# Patient Record
Sex: Male | Born: 1955 | Race: White | Hispanic: No | Marital: Single | Smoking: Former smoker
Health system: Southern US, Community
[De-identification: ages and names within clinical notes are randomized; demographics above are authoritative.]

## PROBLEM LIST (undated history)

## (undated) DIAGNOSIS — G629 Polyneuropathy, unspecified: Secondary | ICD-10-CM

## (undated) DIAGNOSIS — G4730 Sleep apnea, unspecified: Secondary | ICD-10-CM

## (undated) DIAGNOSIS — F319 Bipolar disorder, unspecified: Secondary | ICD-10-CM

## (undated) DIAGNOSIS — F32A Depression, unspecified: Secondary | ICD-10-CM

## (undated) DIAGNOSIS — F209 Schizophrenia, unspecified: Secondary | ICD-10-CM

## (undated) DIAGNOSIS — K5900 Constipation, unspecified: Secondary | ICD-10-CM

## (undated) DIAGNOSIS — E785 Hyperlipidemia, unspecified: Secondary | ICD-10-CM

## (undated) DIAGNOSIS — E291 Testicular hypofunction: Secondary | ICD-10-CM

## (undated) DIAGNOSIS — Z87438 Personal history of other diseases of male genital organs: Secondary | ICD-10-CM

## (undated) DIAGNOSIS — E039 Hypothyroidism, unspecified: Secondary | ICD-10-CM

## (undated) DIAGNOSIS — R7303 Prediabetes: Secondary | ICD-10-CM

## (undated) DIAGNOSIS — L409 Psoriasis, unspecified: Secondary | ICD-10-CM

## (undated) DIAGNOSIS — R609 Edema, unspecified: Secondary | ICD-10-CM

## (undated) DIAGNOSIS — K439 Ventral hernia without obstruction or gangrene: Secondary | ICD-10-CM

## (undated) DIAGNOSIS — E079 Disorder of thyroid, unspecified: Secondary | ICD-10-CM

## (undated) DIAGNOSIS — F419 Anxiety disorder, unspecified: Secondary | ICD-10-CM

## (undated) DIAGNOSIS — F329 Major depressive disorder, single episode, unspecified: Secondary | ICD-10-CM

## (undated) DIAGNOSIS — I739 Peripheral vascular disease, unspecified: Secondary | ICD-10-CM

## (undated) HISTORY — PX: KNEE ARTHROSCOPY: SUR90

## (undated) HISTORY — PX: POSTERIOR FUSION LUMBAR SPINE: SUR632

## (undated) HISTORY — DX: Polyneuropathy, unspecified: G62.9

## (undated) HISTORY — PX: COLONOSCOPY: SHX174

## (undated) HISTORY — DX: Depression, unspecified: F32.A

## (undated) HISTORY — PX: WISDOM TOOTH EXTRACTION: SHX21

## (undated) HISTORY — DX: Edema, unspecified: R60.9

## (undated) HISTORY — DX: Major depressive disorder, single episode, unspecified: F32.9

## (undated) HISTORY — DX: Constipation, unspecified: K59.00

## (undated) HISTORY — PX: KIDNEY SURGERY: SHX687

## (undated) HISTORY — DX: Disorder of thyroid, unspecified: E07.9

## (undated) HISTORY — DX: Anxiety disorder, unspecified: F41.9

---

## 2009-01-09 ENCOUNTER — Ambulatory Visit: Payer: Self-pay | Admitting: Orthopedic Surgery

## 2009-02-12 ENCOUNTER — Ambulatory Visit: Payer: Self-pay | Admitting: Orthopedic Surgery

## 2009-02-18 ENCOUNTER — Ambulatory Visit: Payer: Self-pay | Admitting: Orthopedic Surgery

## 2009-08-22 ENCOUNTER — Ambulatory Visit: Payer: Self-pay | Admitting: Internal Medicine

## 2009-09-03 ENCOUNTER — Ambulatory Visit: Payer: Self-pay | Admitting: Internal Medicine

## 2010-01-09 ENCOUNTER — Ambulatory Visit: Payer: Self-pay | Admitting: Orthopedic Surgery

## 2010-02-03 ENCOUNTER — Ambulatory Visit: Payer: Self-pay | Admitting: Pain Medicine

## 2010-02-19 ENCOUNTER — Ambulatory Visit: Payer: Self-pay | Admitting: Pain Medicine

## 2010-04-09 ENCOUNTER — Ambulatory Visit: Payer: Self-pay | Admitting: Pain Medicine

## 2010-04-23 ENCOUNTER — Ambulatory Visit: Payer: Self-pay | Admitting: Pain Medicine

## 2010-11-02 ENCOUNTER — Ambulatory Visit: Payer: Self-pay | Admitting: Unknown Physician Specialty

## 2010-12-06 HISTORY — PX: BACK SURGERY: SHX140

## 2011-03-03 ENCOUNTER — Ambulatory Visit: Payer: Self-pay | Admitting: Pain Medicine

## 2011-03-11 ENCOUNTER — Ambulatory Visit: Payer: Self-pay | Admitting: Pain Medicine

## 2011-03-24 ENCOUNTER — Ambulatory Visit: Payer: Self-pay | Admitting: Pain Medicine

## 2011-03-25 ENCOUNTER — Other Ambulatory Visit: Payer: Self-pay | Admitting: Pain Medicine

## 2011-04-01 ENCOUNTER — Ambulatory Visit: Payer: Self-pay | Admitting: Pain Medicine

## 2011-06-18 ENCOUNTER — Ambulatory Visit: Payer: Self-pay | Admitting: Unknown Physician Specialty

## 2011-08-05 ENCOUNTER — Ambulatory Visit: Payer: Self-pay | Admitting: Unknown Physician Specialty

## 2011-08-10 ENCOUNTER — Inpatient Hospital Stay: Payer: Self-pay | Admitting: Unknown Physician Specialty

## 2013-10-03 ENCOUNTER — Ambulatory Visit (INDEPENDENT_AMBULATORY_CARE_PROVIDER_SITE_OTHER): Payer: PRIVATE HEALTH INSURANCE | Admitting: Podiatry

## 2013-10-03 ENCOUNTER — Encounter: Payer: Self-pay | Admitting: Podiatry

## 2013-10-03 VITALS — BP 123/84 | HR 75 | Resp 16 | Ht 72.0 in | Wt 280.0 lb

## 2013-10-03 DIAGNOSIS — B351 Tinea unguium: Secondary | ICD-10-CM

## 2013-10-03 DIAGNOSIS — M79609 Pain in unspecified limb: Secondary | ICD-10-CM

## 2013-10-03 NOTE — Progress Notes (Signed)
Geoffrey Cruz presents today as a diabetic individual with a chief complaint of painful toenails bilateral.  Objective: Vital signs are stable he is alert and oriented x3. Nails are thick yellow dystrophic clinically mycotic and painful on palpation and debridement.  Assessment: Pain in limb secondary to onychomycosis.  Plan: The right nails 1 through 5 bilateral covered service secondary to pain. Followup with him in 3 months.

## 2013-11-05 ENCOUNTER — Ambulatory Visit: Payer: Self-pay | Admitting: Unknown Physician Specialty

## 2013-12-10 ENCOUNTER — Encounter: Payer: Self-pay | Admitting: Podiatry

## 2013-12-10 ENCOUNTER — Ambulatory Visit (INDEPENDENT_AMBULATORY_CARE_PROVIDER_SITE_OTHER): Payer: PRIVATE HEALTH INSURANCE | Admitting: Podiatry

## 2013-12-10 VITALS — BP 126/77 | HR 71 | Resp 16

## 2013-12-10 DIAGNOSIS — M79609 Pain in unspecified limb: Secondary | ICD-10-CM

## 2013-12-10 DIAGNOSIS — B353 Tinea pedis: Secondary | ICD-10-CM

## 2013-12-10 DIAGNOSIS — B351 Tinea unguium: Secondary | ICD-10-CM

## 2013-12-10 NOTE — Progress Notes (Signed)
   Subjective:    Patient ID: Geoffrey Cruz, male    DOB: September 28, 1956, 58 y.o.   MRN: [id number]  HPI Comments: " trim my toenails and check the 3rd toenail left foot , its dead or something"     Review of Systems     Objective:   Physical Exam: Pulses are palpable bilateral. Nails are thick yellow dystrophic clinically mycotic and painful on palpation.        Assessment & Plan:  Assessment: Pain in limb secondary to onychomycosis.  Plan: Debridement of nails 1 through 5 bilateral is cover service secondary to pain. Followup with him in 3 months.

## 2013-12-18 ENCOUNTER — Encounter: Payer: Self-pay | Admitting: Urology

## 2013-12-18 LAB — HEMOGLOBIN: HGB: 17.5 g/dL (ref 13.0–18.0)

## 2013-12-18 LAB — HEMATOCRIT: HCT: 51.2 % (ref 40.0–52.0)

## 2013-12-26 LAB — CBC WITH DIFFERENTIAL/PLATELET
Basophil #: 0.1 10*3/uL (ref 0.0–0.1)
Basophil %: 1 %
Eosinophil #: 0.2 10*3/uL (ref 0.0–0.7)
Eosinophil %: 2 %
HCT: 48.5 % (ref 40.0–52.0)
HGB: 16.3 g/dL (ref 13.0–18.0)
Lymphocyte #: 2.2 10*3/uL (ref 1.0–3.6)
Lymphocyte %: 20.8 %
MCH: 30.6 pg (ref 26.0–34.0)
MCHC: 33.5 g/dL (ref 32.0–36.0)
MCV: 91 fL (ref 80–100)
Monocyte #: 1 x10 3/mm (ref 0.2–1.0)
Monocyte %: 9.5 %
NEUTROS ABS: 7.2 10*3/uL — AB (ref 1.4–6.5)
Neutrophil %: 66.7 %
PLATELETS: 196 10*3/uL (ref 150–440)
RBC: 5.31 10*6/uL (ref 4.40–5.90)
RDW: 13.9 % (ref 11.5–14.5)
WBC: 10.7 10*3/uL — ABNORMAL HIGH (ref 3.8–10.6)

## 2014-01-02 ENCOUNTER — Ambulatory Visit: Payer: PRIVATE HEALTH INSURANCE | Admitting: Podiatry

## 2014-01-06 ENCOUNTER — Encounter: Payer: Self-pay | Admitting: Urology

## 2014-03-11 ENCOUNTER — Ambulatory Visit: Payer: PRIVATE HEALTH INSURANCE | Admitting: Podiatry

## 2014-03-18 ENCOUNTER — Ambulatory Visit (INDEPENDENT_AMBULATORY_CARE_PROVIDER_SITE_OTHER): Payer: PRIVATE HEALTH INSURANCE | Admitting: Podiatry

## 2014-03-18 VITALS — Resp 16 | Ht 72.0 in | Wt 275.0 lb

## 2014-03-18 DIAGNOSIS — B353 Tinea pedis: Secondary | ICD-10-CM

## 2014-03-18 DIAGNOSIS — M79609 Pain in unspecified limb: Secondary | ICD-10-CM

## 2014-03-18 NOTE — Progress Notes (Signed)
He presents today chief complaint of painful toenails one through 5 bilateral.  Objective: Pulses are palpable bilateral. Nails are thick yellow dystrophic with mycotic painful palpation as well as debridement with subungual debris.  Assessment: Pain in limb secondary to onychomycosis 1-5 bilateral.  Plan: Debridement of nails 1 through 5 bilateral covered service secondary to pain.

## 2014-06-17 ENCOUNTER — Encounter: Payer: Self-pay | Admitting: Podiatry

## 2014-06-17 ENCOUNTER — Ambulatory Visit (INDEPENDENT_AMBULATORY_CARE_PROVIDER_SITE_OTHER): Payer: PRIVATE HEALTH INSURANCE | Admitting: Podiatry

## 2014-06-17 DIAGNOSIS — M79676 Pain in unspecified toe(s): Secondary | ICD-10-CM

## 2014-06-17 DIAGNOSIS — M79609 Pain in unspecified limb: Secondary | ICD-10-CM

## 2014-06-17 DIAGNOSIS — B351 Tinea unguium: Secondary | ICD-10-CM

## 2014-06-17 NOTE — Progress Notes (Signed)
He presents today complaining of painful elongated toenails  Objective: Vital signs are stable he is alert and oriented x3. Nails are thick yellow dystrophic with mycotic and painful palpation.  Assessment: Pain in limb secondary to onychomycosis 1 through 5 bilateral.  Plan: Debridement nails 1 through 5 bilateral covered service secondary to pain.

## 2014-09-23 ENCOUNTER — Ambulatory Visit (INDEPENDENT_AMBULATORY_CARE_PROVIDER_SITE_OTHER): Payer: PRIVATE HEALTH INSURANCE | Admitting: Podiatry

## 2014-09-23 DIAGNOSIS — M79676 Pain in unspecified toe(s): Secondary | ICD-10-CM

## 2014-09-23 DIAGNOSIS — B351 Tinea unguium: Secondary | ICD-10-CM

## 2014-09-23 NOTE — Progress Notes (Signed)
Presents today chief complaint of painful elongated toenails.  Objective: Pulses are palpable bilateral nails are thick, yellow dystrophic onychomycosis and painful palpation.   Assessment: Onychomycosis with pain in limb.  Plan: Treatment of nails in thickness and length as covered service secondary to pain.  

## 2014-12-04 ENCOUNTER — Ambulatory Visit (INDEPENDENT_AMBULATORY_CARE_PROVIDER_SITE_OTHER): Payer: PRIVATE HEALTH INSURANCE | Admitting: Podiatry

## 2014-12-04 VITALS — BP 137/81 | HR 71 | Resp 16

## 2014-12-04 DIAGNOSIS — B351 Tinea unguium: Secondary | ICD-10-CM

## 2014-12-04 DIAGNOSIS — M79676 Pain in unspecified toe(s): Secondary | ICD-10-CM

## 2014-12-04 NOTE — Progress Notes (Signed)
He presents today to complaint of painful elongated toenails and calluses bilateral.  Objective: Vital signs are stable he is alert and oriented 3. There is no erythema the edema cellulitis drainage or odor. Nails are thick yellow dystrophic clinically mycotic and painful on palpation.  Assessment: Pain and limb secondary to onychomycosis 1 through 5 bilateral.  Plan: Debridement of nails 1 through 5 bilateral cover service secondary to pain.

## 2014-12-23 ENCOUNTER — Ambulatory Visit: Payer: PRIVATE HEALTH INSURANCE

## 2014-12-23 ENCOUNTER — Ambulatory Visit: Payer: PRIVATE HEALTH INSURANCE | Admitting: Podiatry

## 2014-12-30 DIAGNOSIS — R739 Hyperglycemia, unspecified: Secondary | ICD-10-CM | POA: Insufficient documentation

## 2015-01-08 ENCOUNTER — Ambulatory Visit (INDEPENDENT_AMBULATORY_CARE_PROVIDER_SITE_OTHER): Payer: Commercial Managed Care - PPO | Admitting: Podiatry

## 2015-01-08 DIAGNOSIS — M722 Plantar fascial fibromatosis: Secondary | ICD-10-CM

## 2015-01-08 NOTE — Progress Notes (Signed)
He presents today for follow-up of his plantar fasciitis would like to have his nails cut if possible. He did also like to purchase appeared orthotics. He states that the ones he has now seemed to be wearing and he would like to have another pair made.  Objective: Vital signs are stable he is alert and oriented 3. Pulses are strongly palpable bilateral. His nails are thick yellow dystrophic onychomycotic a painful palpation 1 through 5 bilateral. He has pain on palpation medial calcaneal tubercles bilateral.  Assessment: Plantar fasciitis bilateral. Pain in limb second onychomycosis bilateral.  Plan: He was scanned for set of orthotics today we also debrided his nails. Follow up with him in 3 weeks if necessary. He will keep his regular nail debridement schedule.

## 2015-02-03 ENCOUNTER — Ambulatory Visit (INDEPENDENT_AMBULATORY_CARE_PROVIDER_SITE_OTHER): Payer: Commercial Managed Care - PPO | Admitting: *Deleted

## 2015-02-03 DIAGNOSIS — M722 Plantar fascial fibromatosis: Secondary | ICD-10-CM

## 2015-02-03 NOTE — Patient Instructions (Signed)

## 2015-02-03 NOTE — Progress Notes (Signed)
Orthotics dispensed. Instructions given . Will see for normal nail care appointment in 2-3 mo. Evaluate orthotics at that time also.

## 2015-03-03 ENCOUNTER — Ambulatory Visit: Payer: PRIVATE HEALTH INSURANCE

## 2015-04-07 ENCOUNTER — Ambulatory Visit (INDEPENDENT_AMBULATORY_CARE_PROVIDER_SITE_OTHER): Payer: Medicaid Other | Admitting: Podiatry

## 2015-04-07 DIAGNOSIS — B351 Tinea unguium: Secondary | ICD-10-CM | POA: Diagnosis not present

## 2015-04-07 DIAGNOSIS — M79676 Pain in unspecified toe(s): Secondary | ICD-10-CM

## 2015-04-07 NOTE — Progress Notes (Signed)
He presents today to complaint of painful elongated toenails and calluses bilateral.  Objective: Vital signs are stable he is alert and oriented 3. There is no erythema the edema cellulitis drainage or odor. Nails are thick yellow dystrophic clinically mycotic and painful on palpation.  Assessment: Pain and limb secondary to onychomycosis 1 through 5 bilateral.  Plan: Debridement of nails 1 through 5 bilateral cover service secondary to pain. 

## 2015-07-08 ENCOUNTER — Ambulatory Visit: Payer: Commercial Managed Care - PPO | Admitting: Podiatry

## 2015-07-21 ENCOUNTER — Ambulatory Visit (INDEPENDENT_AMBULATORY_CARE_PROVIDER_SITE_OTHER): Payer: Medicare Other | Admitting: Podiatry

## 2015-07-21 DIAGNOSIS — M79676 Pain in unspecified toe(s): Secondary | ICD-10-CM

## 2015-07-21 DIAGNOSIS — B351 Tinea unguium: Secondary | ICD-10-CM

## 2015-07-21 NOTE — Progress Notes (Signed)
He presents today to complaint of painful elongated toenails and calluses bilateral. States that his thyroid disease and stable adhesive has some numbness and tingling to the bilateral foot.  Objective: Vital signs are stable he is alert and oriented 3. There is no erythema the edema cellulitis drainage or odor. Nails are thick yellow dystrophic clinically mycotic and painful on palpation. Pulses are strongly palpable bilateral.  Assessment: Pain and limb secondary to onychomycosis 1 through 5 bilateral. Neuropathy associated with hypothyroidism  Plan: Debridement of nails 1 through 5 bilateral cover service secondary to pain. Continue application of topical analgesias.

## 2015-10-20 ENCOUNTER — Ambulatory Visit (INDEPENDENT_AMBULATORY_CARE_PROVIDER_SITE_OTHER): Payer: Medicare Other | Admitting: Podiatry

## 2015-10-20 DIAGNOSIS — M79676 Pain in unspecified toe(s): Secondary | ICD-10-CM | POA: Diagnosis not present

## 2015-10-20 DIAGNOSIS — B351 Tinea unguium: Secondary | ICD-10-CM

## 2015-10-20 NOTE — Progress Notes (Signed)
He presents today to complaint of painful elongated toenails and calluses bilateral.  Objective: Vital signs are stable he is alert and oriented 3. There is no erythema the edema cellulitis drainage or odor. Nails are thick yellow dystrophic clinically mycotic and painful on palpation.  Assessment: Pain and limb secondary to onychomycosis 1 through 5 bilateral.  Plan: Debridement of nails 1 through 5 bilateral cover service secondary to pain.3 mo. Skin softener to distal hallux callus/dryness.  Arbutus Pedodd Hyatt DPM

## 2016-01-20 ENCOUNTER — Encounter: Payer: Self-pay | Admitting: Sports Medicine

## 2016-01-20 ENCOUNTER — Ambulatory Visit (INDEPENDENT_AMBULATORY_CARE_PROVIDER_SITE_OTHER): Payer: Medicare Other | Admitting: Sports Medicine

## 2016-01-20 DIAGNOSIS — M79676 Pain in unspecified toe(s): Secondary | ICD-10-CM | POA: Diagnosis not present

## 2016-01-20 DIAGNOSIS — B351 Tinea unguium: Secondary | ICD-10-CM | POA: Diagnosis not present

## 2016-01-20 DIAGNOSIS — G629 Polyneuropathy, unspecified: Secondary | ICD-10-CM | POA: Diagnosis not present

## 2016-01-20 NOTE — Progress Notes (Signed)
Patient ID: MATH BRAZIE, male   DOB: 25-Jul-1956, 60 y.o.   MRN: [id number] Subjective: Geoffrey Cruz is a 60 y.o. male patient seen today in office with complaint of painful thickened and elongated toenails; unable to trim. Patient denies history of Diabetes or Vascular disease. 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 Patient has no other pedal complaints at this time.   There are no active problems to display for this patient.  Current Outpatient Prescriptions on File Prior to Visit  Medication Sig Dispense Refill  . ARIPiprazole (ABILIFY) 20 MG tablet Take 20 mg by mouth daily.    Marland Kitchen aspirin 81 MG tablet Take 81 mg by mouth daily.    Marland Kitchen buPROPion (WELLBUTRIN SR) 200 MG 12 hr tablet Take 200 mg by mouth 2 (two) times daily.    . clonazePAM (KLONOPIN) 1 MG tablet Take 1 mg by mouth 2 (two) times daily as needed for anxiety.    . fluvoxaMINE (LUVOX) 100 MG tablet Take 100 mg by mouth 2 (two) times daily.    . furosemide (LASIX) 40 MG tablet Take 40 mg by mouth daily.    Marland Kitchen levothyroxine (SYNTHROID, LEVOTHROID) 88 MCG tablet Take 88 mcg by mouth daily before breakfast.     No current facility-administered medications on file prior to visit.   Allergies  Allergen Reactions  . Hydrocodone-Acetaminophen Swelling    Swelling in ankles  . Propoxyphene Nausea Only  . Codeine Other (See Comments) and Anxiety    NERVOUS FEELING   Objective: Physical Exam  General: Well developed, nourished, no acute distress, awake, alert and oriented x 3  Vascular: Dorsalis pedis artery 2/4 bilateral, Posterior tibial artery 1/4 bilateral, skin temperature warm to warm proximal to distal bilateral lower extremities, no varicosities, scant pedal hair present bilateral.  Neurological: Gross sensation present via light touch bilateral. Vibratory diminished bilateral using tuning fork. Protective absent to toes bilateral using SWMF.   Dermatological: Skin is warm, dry, and supple bilateral,  Nails 1-10 are tender, long, thick, and discolored with mild subungal debris, no webspace macerations present bilateral, no open lesions present bilateral, no callus/corns/hyperkeratotic tissue present bilateral. No signs of infection bilateral.  Musculoskeletal: No boney deformities noted bilateral. Muscular strength within normal limits without pain or limitation on range of motion. No pain with calf compression bilateral.  Assessment and Plan:  Problem List Items Addressed This Visit    None    Visit Diagnoses    Dermatophytosis of nail    -  Primary    Pain of toe, unspecified laterality        Peripheral polyneuropathy (HCC)        Relevant Medications    clonazePAM (KLONOPIN) 0.5 MG tablet       -Examined patient.  -Discussed treatment options for painful mycotic nails. -Mechanically debrided and reduced mycotic nails with sterile nail nipper and dremel nail file without incident. -Recommend good supportive shoes daily and daily foot inspection -Recommend daily skin emollients  -Patient to return in 3 months for follow up evaluation or sooner if symptoms worsen.  Asencion Islam, DPM

## 2016-04-20 ENCOUNTER — Ambulatory Visit (INDEPENDENT_AMBULATORY_CARE_PROVIDER_SITE_OTHER): Payer: Medicare Other | Admitting: Sports Medicine

## 2016-04-20 ENCOUNTER — Encounter: Payer: Self-pay | Admitting: Sports Medicine

## 2016-04-20 DIAGNOSIS — B351 Tinea unguium: Secondary | ICD-10-CM

## 2016-04-20 DIAGNOSIS — F209 Schizophrenia, unspecified: Secondary | ICD-10-CM | POA: Insufficient documentation

## 2016-04-20 DIAGNOSIS — F329 Major depressive disorder, single episode, unspecified: Secondary | ICD-10-CM | POA: Insufficient documentation

## 2016-04-20 DIAGNOSIS — M79676 Pain in unspecified toe(s): Secondary | ICD-10-CM | POA: Diagnosis not present

## 2016-04-20 DIAGNOSIS — F32A Depression, unspecified: Secondary | ICD-10-CM | POA: Insufficient documentation

## 2016-04-20 DIAGNOSIS — G629 Polyneuropathy, unspecified: Secondary | ICD-10-CM

## 2016-04-20 DIAGNOSIS — Z87438 Personal history of other diseases of male genital organs: Secondary | ICD-10-CM | POA: Insufficient documentation

## 2016-04-20 DIAGNOSIS — E039 Hypothyroidism, unspecified: Secondary | ICD-10-CM | POA: Insufficient documentation

## 2016-04-20 DIAGNOSIS — G4730 Sleep apnea, unspecified: Secondary | ICD-10-CM | POA: Insufficient documentation

## 2016-04-20 NOTE — Progress Notes (Signed)
Patient ID: Magnus SinningDonald T Heeren, male   DOB: [birth year], 60 y.o.   MRN: [id number]   Subjective: Magnus SinningDonald T Schlatter is a 60 y.o. male patient seen today in office with complaint of painful thickened and elongated toenails; unable to trim. Patient denies history of Diabetes or Vascular disease. 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 Patient has no other pedal complaints at this time.   Patient Active Problem List   Diagnosis Date Noted  . Clinical depression 04/20/2016  . Personal history of other diseases of male genital organs 04/20/2016  . Adult hypothyroidism 04/20/2016  . Schizophrenia (HCC) 04/20/2016  . Apnea, sleep 04/20/2016  . Chronic hyperglycemia 12/30/2014   Current Outpatient Prescriptions on File Prior to Visit  Medication Sig Dispense Refill  . Acetaminophen 500 MG coapsule Take by mouth.    . ARIPiprazole (ABILIFY) 20 MG tablet Take 20 mg by mouth daily.    Marland Kitchen. aspirin 81 MG tablet Take 81 mg by mouth daily.    Marland Kitchen. buPROPion (WELLBUTRIN SR) 200 MG 12 hr tablet Take 200 mg by mouth 2 (two) times daily.    . clonazePAM (KLONOPIN) 0.5 MG tablet     . clonazePAM (KLONOPIN) 1 MG tablet Take 1 mg by mouth 2 (two) times daily as needed for anxiety.    . fluticasone (FLONASE) 50 MCG/ACT nasal spray Place into the nose.    . fluvoxaMINE (LUVOX) 100 MG tablet Take 100 mg by mouth 2 (two) times daily.    . furosemide (LASIX) 40 MG tablet Take 40 mg by mouth daily.    Marland Kitchen. levothyroxine (SYNTHROID, LEVOTHROID) 88 MCG tablet Take 88 mcg by mouth daily before breakfast.    . tamsulosin (FLOMAX) 0.4 MG CAPS capsule      No current facility-administered medications on file prior to visit.   Allergies  Allergen Reactions  . Hydrocodone-Acetaminophen Swelling    Swelling in ankles  . Propoxyphene Nausea Only  . Codeine Other (See Comments) and Anxiety    NERVOUS FEELING   Objective: Physical Exam  General: Well developed, nourished, no acute distress, awake, alert and  oriented x 3  Vascular: Dorsalis pedis artery 2/4 bilateral, Posterior tibial artery 1/4 bilateral, skin temperature warm to warm proximal to distal bilateral lower extremities, no varicosities, scant pedal hair present bilateral.  Neurological: Gross sensation present via light touch bilateral. Vibratory diminished bilateral using tuning fork. Protective absent to toes bilateral using SWMF.   Dermatological: Skin is warm, dry, and supple bilateral, Nails 1-10 are tender, long, thick, and discolored with mild subungal debris, no webspace macerations present bilateral, no open lesions present bilateral, no callus/corns/hyperkeratotic tissue present bilateral. No signs of infection bilateral.  Musculoskeletal: No boney deformities noted bilateral. Muscular strength within normal limits without pain or limitation on range of motion. No pain with calf compression bilateral.  Assessment and Plan:  Problem List Items Addressed This Visit    None    Visit Diagnoses    Dermatophytosis of nail    -  Primary    Pain of toe, unspecified laterality        Peripheral polyneuropathy (HCC)           -Examined patient.  -Discussed treatment options for painful mycotic nails. -Mechanically debrided and reduced mycotic nails with sterile nail nipper and dremel nail file without incident. -Recommend good supportive shoes daily and daily foot inspection -Recommend daily skin emollients  -Patient to return in 3 months for follow up evaluation or sooner if symptoms  worsen.  Landis Martins, DPM

## 2016-07-27 ENCOUNTER — Encounter: Payer: Self-pay | Admitting: Sports Medicine

## 2016-07-27 ENCOUNTER — Ambulatory Visit (INDEPENDENT_AMBULATORY_CARE_PROVIDER_SITE_OTHER): Payer: Medicare Other | Admitting: Sports Medicine

## 2016-07-27 DIAGNOSIS — M79676 Pain in unspecified toe(s): Secondary | ICD-10-CM | POA: Diagnosis not present

## 2016-07-27 DIAGNOSIS — B351 Tinea unguium: Secondary | ICD-10-CM | POA: Diagnosis not present

## 2016-07-27 DIAGNOSIS — G629 Polyneuropathy, unspecified: Secondary | ICD-10-CM

## 2016-07-27 NOTE — Progress Notes (Signed)
Patient ID: Geoffrey SinningDonald T Cruz, male   DOB: 09/18/1956, 60 y.o.   MRN: [id number]   Subjective: Geoffrey Cruz is a 60 y.o. male patient seen today in office with complaint of painful thickened and elongated toenails; unable to trim. Patient denies changes with medical history. 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 Patient has no other pedal complaints at this time.   Patient Active Problem List   Diagnosis Date Noted  . Clinical depression 04/20/2016  . Personal history of other diseases of male genital organs 04/20/2016  . Adult hypothyroidism 04/20/2016  . Schizophrenia (HCC) 04/20/2016  . Apnea, sleep 04/20/2016  . Chronic hyperglycemia 12/30/2014   Current Outpatient Prescriptions on File Prior to Visit  Medication Sig Dispense Refill  . Acetaminophen 500 MG coapsule Take by mouth.    . ARIPiprazole (ABILIFY) 20 MG tablet Take 20 mg by mouth daily.    Marland Kitchen. aspirin 81 MG tablet Take 81 mg by mouth daily.    Marland Kitchen. buPROPion (WELLBUTRIN SR) 200 MG 12 hr tablet Take 200 mg by mouth 2 (two) times daily.    . clonazePAM (KLONOPIN) 0.5 MG tablet     . clonazePAM (KLONOPIN) 1 MG tablet Take 1 mg by mouth 2 (two) times daily as needed for anxiety.    . fluticasone (FLONASE) 50 MCG/ACT nasal spray Place into the nose.    . fluvoxaMINE (LUVOX) 100 MG tablet Take 100 mg by mouth 2 (two) times daily.    . furosemide (LASIX) 40 MG tablet Take 40 mg by mouth daily.    Marland Kitchen. levothyroxine (SYNTHROID, LEVOTHROID) 88 MCG tablet Take 88 mcg by mouth daily before breakfast.    . tamsulosin (FLOMAX) 0.4 MG CAPS capsule      No current facility-administered medications on file prior to visit.    Allergies  Allergen Reactions  . Hydrocodone-Acetaminophen Swelling    Swelling in ankles  . Propoxyphene Nausea Only  . Codeine Other (See Comments) and Anxiety    NERVOUS FEELING   Objective: Physical Exam  General: Well developed, nourished, no acute distress, awake, alert and oriented x  3  Vascular: Dorsalis pedis artery 2/4 bilateral, Posterior tibial artery 1/4 bilateral, skin temperature warm to warm proximal to distal bilateral lower extremities, no varicosities, scant pedal hair present bilateral.  Neurological: Gross sensation present via light touch bilateral. Vibratory diminished bilateral using tuning fork. Protective absent to toes bilateral using SWMF.   Dermatological: Skin is warm, dry, and supple bilateral, Nails 1-10 are tender, long, thick, and discolored with mild subungal debris, no webspace macerations present bilateral, no open lesions present bilateral, no callus/corns/hyperkeratotic tissue present bilateral. No signs of infection bilateral.  Musculoskeletal: Mild hallux malleous boney deformities noted bilateral R>L. Muscular strength within normal limits without pain or limitation on range of motion. No pain with calf compression bilateral.  Assessment and Plan:  Problem List Items Addressed This Visit    None    Visit Diagnoses    Dermatophytosis of nail    -  Primary   Pain of toe, unspecified laterality       Peripheral polyneuropathy (HCC)          -Examined patient.  -Discussed treatment options for painful mycotic nails. -Mechanically debrided and reduced mycotic nails with sterile nail nipper and dremel nail file without incident. -Recommend good supportive shoes daily and daily foot inspection -Recommend daily skin emollients  -Toe silicone pad right hallux to use as instructed  -Patient to return in 3  months for follow up evaluation or sooner if symptoms worsen.  Landis Martins, DPM

## 2016-11-02 ENCOUNTER — Ambulatory Visit (INDEPENDENT_AMBULATORY_CARE_PROVIDER_SITE_OTHER): Payer: Medicare Other | Admitting: Podiatry

## 2016-11-02 ENCOUNTER — Encounter: Payer: Self-pay | Admitting: Podiatry

## 2016-11-02 DIAGNOSIS — L608 Other nail disorders: Secondary | ICD-10-CM

## 2016-11-02 DIAGNOSIS — M79609 Pain in unspecified limb: Secondary | ICD-10-CM

## 2016-11-02 DIAGNOSIS — B351 Tinea unguium: Secondary | ICD-10-CM

## 2016-11-02 DIAGNOSIS — L603 Nail dystrophy: Secondary | ICD-10-CM

## 2016-11-03 NOTE — Progress Notes (Signed)

## 2017-01-31 ENCOUNTER — Encounter: Payer: Self-pay | Admitting: Podiatry

## 2017-01-31 ENCOUNTER — Ambulatory Visit (INDEPENDENT_AMBULATORY_CARE_PROVIDER_SITE_OTHER): Payer: Medicare Other | Admitting: Podiatry

## 2017-01-31 DIAGNOSIS — M79609 Pain in unspecified limb: Secondary | ICD-10-CM | POA: Diagnosis not present

## 2017-01-31 DIAGNOSIS — G629 Polyneuropathy, unspecified: Secondary | ICD-10-CM

## 2017-01-31 DIAGNOSIS — B351 Tinea unguium: Secondary | ICD-10-CM | POA: Diagnosis not present

## 2017-01-31 NOTE — Progress Notes (Signed)
Complaint:  Visit Type: Patient returns to my office for continued preventative foot care services. 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 The patient presents for preventative foot care services. No changes to ROS  Podiatric Exam: Vascular: dorsalis pedis and posterior tibial pulses are palpable bilateral. Capillary return is immediate. Temperature gradient is WNL. Skin turgor WNL  Sensorium: Diminished Semmes Weinstein monofilament test. Normal tactile sensation bilaterally. Nail Exam: Pt has thick disfigured discolored nails with subungual debris noted bilateral entire nail hallux through fifth toenails Ulcer Exam: There is no evidence of ulcer or pre-ulcerative changes or infection. Orthopedic Exam: Muscle tone and strength are WNL. No limitations in general ROM. No crepitus or effusions noted. Foot type and digits show no abnormalities. Bony prominences are unremarkable. Skin: No Porokeratosis. No infection or ulcers  Diagnosis:  Onychomycosis, , Pain in right toe, pain in left toes  Treatment & Plan Procedures and Treatment: Consent by patient was obtained for treatment procedures. The patient understood the discussion of treatment and procedures well. All questions were answered thoroughly reviewed. Debridement of mycotic and hypertrophic toenails, 1 through 5 bilateral and clearing of subungual debris. No ulceration, no infection noted.  Return Visit-Office Procedure: Patient instructed to return to the office for a follow up visit 3 months for continued evaluation and treatment.    Helane GuntherGregory Jeris Roser DPM

## 2017-03-23 ENCOUNTER — Encounter: Payer: Self-pay | Admitting: Podiatry

## 2017-03-23 ENCOUNTER — Ambulatory Visit (INDEPENDENT_AMBULATORY_CARE_PROVIDER_SITE_OTHER): Payer: Medicare Other | Admitting: Podiatry

## 2017-03-23 DIAGNOSIS — M722 Plantar fascial fibromatosis: Secondary | ICD-10-CM

## 2017-03-23 NOTE — Progress Notes (Signed)
Geoffrey Cruz presents today with chief complaint of bilateral heel pain times past month. He states that the orthotics may be wearing out. He states he's tried new shoes.  Objective: Vital signs are stable he is alert and oriented 3 pulses are palpable. Neurologic sensorium is intact. Deep tendon reflexes are intact. He has severe pain on palpation medially continue tubercles bilateral no pain on medial and lateral compression of the calcaneus. Radiographs were not taken today because of equipment failure. assessment: Pain in limb secondary to onychomycosis.  Plantar fasciitis bilateral.  Plan: Injection bilateral heels today he was scanned for set of orthotics.

## 2017-04-20 ENCOUNTER — Ambulatory Visit: Payer: Medicare Other | Admitting: Podiatry

## 2017-05-05 ENCOUNTER — Encounter: Payer: Self-pay | Admitting: Podiatry

## 2017-05-05 ENCOUNTER — Ambulatory Visit: Payer: Medicare Other

## 2017-05-05 ENCOUNTER — Ambulatory Visit (INDEPENDENT_AMBULATORY_CARE_PROVIDER_SITE_OTHER): Payer: Medicare Other | Admitting: Podiatry

## 2017-05-05 DIAGNOSIS — B351 Tinea unguium: Secondary | ICD-10-CM

## 2017-05-05 DIAGNOSIS — Q828 Other specified congenital malformations of skin: Secondary | ICD-10-CM | POA: Diagnosis not present

## 2017-05-05 DIAGNOSIS — M79673 Pain in unspecified foot: Secondary | ICD-10-CM | POA: Diagnosis not present

## 2017-05-05 DIAGNOSIS — G629 Polyneuropathy, unspecified: Secondary | ICD-10-CM

## 2017-05-05 NOTE — Progress Notes (Signed)
Complaint:  Visit Type: Patient returns to my office for continued preventative foot care services. 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 The patient presents for preventative foot care services. No changes to ROS.  Heels have improved from previous visit.  Painful callus left heel.  Podiatric Exam: Vascular: dorsalis pedis and posterior tibial pulses are palpable bilateral. Capillary return is immediate. Temperature gradient is WNL. Skin turgor WNL  Sensorium: Diminished Semmes Weinstein monofilament test. Normal tactile sensation bilaterally. Nail Exam: Pt has thick disfigured discolored nails with subungual debris noted bilateral entire nail hallux through fifth toenails Ulcer Exam: There is no evidence of ulcer or pre-ulcerative changes or infection. Orthopedic Exam: Muscle tone and strength are WNL. No limitations in general ROM. No crepitus or effusions noted. Foot type and digits show no abnormalities. Bony prominences are unremarkable. Skin: No Porokeratosis. No infection or ulcers  Diagnosis:  Onychomycosis, , Pain in right toe, pain in left toes  Treatment & Plan Procedures and Treatment: Consent by patient was obtained for treatment procedures. The patient understood the discussion of treatment and procedures well. All questions were answered thoroughly reviewed. Debridement of mycotic and hypertrophic toenails, 1 through 5 bilateral and clearing of subungual debris. No ulceration, no infection noted. Debride callus.  Dispense orthoses. Return Visit-Office Procedure: Patient instructed to return to the office for a follow up visit 3 months for continued evaluation and treatment.    Helane GuntherGregory Katlyne Nishida DPM

## 2017-06-14 ENCOUNTER — Ambulatory Visit (INDEPENDENT_AMBULATORY_CARE_PROVIDER_SITE_OTHER): Payer: Medicare Other | Admitting: Podiatry

## 2017-06-14 DIAGNOSIS — L03031 Cellulitis of right toe: Secondary | ICD-10-CM | POA: Diagnosis not present

## 2017-06-14 DIAGNOSIS — M79676 Pain in unspecified toe(s): Secondary | ICD-10-CM | POA: Diagnosis not present

## 2017-06-14 DIAGNOSIS — B351 Tinea unguium: Secondary | ICD-10-CM

## 2017-06-14 DIAGNOSIS — L603 Nail dystrophy: Secondary | ICD-10-CM

## 2017-06-14 MED ORDER — AMOXICILLIN-POT CLAVULANATE 875-125 MG PO TABS: 1.0000 | ORAL_TABLET | Freq: Two times a day (BID) | ORAL | 0 refills | Status: DC

## 2017-06-14 NOTE — Patient Instructions (Signed)

## 2017-06-20 ENCOUNTER — Ambulatory Visit: Payer: Medicare Other | Admitting: Podiatry

## 2017-06-22 ENCOUNTER — Ambulatory Visit: Payer: Medicare Other | Admitting: Podiatry

## 2017-06-28 ENCOUNTER — Ambulatory Visit (INDEPENDENT_AMBULATORY_CARE_PROVIDER_SITE_OTHER): Payer: Medicare Other | Admitting: Podiatry

## 2017-06-28 DIAGNOSIS — S91209D Unspecified open wound of unspecified toe(s) with damage to nail, subsequent encounter: Secondary | ICD-10-CM

## 2017-06-28 NOTE — Progress Notes (Signed)
   Subjective: Patient presents today for 2 week follow-up for total nail avulsion to the right great toe. Patient states that it's doing much better. Patient still having a little bit of redness with burning sensations however he completed his amoxicillin. He is otherwise satisfied with no new complaints.  Objective: Skin is warm, dry and supple. Open wound noted to the right great toenail avulsion site with minimal fibrotic tissue. There is no drainage. Periungual nail folds are mildly erythematous likely due to phenol reaction. There is no malodor.  Assessment: #1 postop permanent total nail avulsion right great toe #2 open wound nail bed of respective digit.   Plan of care: #1 patient was evaluated  #2 debridement of open wound was performed to the nailbed of the respective toe using a tissue nipper. Antibiotic ointment and Band-Aid was applied. #3 patient is to return to clinic on a PRN  basis.   Felecia ShellingBrent M. Evans, DPM [facility]  Dr. Felecia ShellingBrent M. Evans, DPM    [address]                                        [place], [place] [address]                Office [phone]  Fax [phone]

## 2017-07-02 NOTE — Progress Notes (Signed)
   HPI: 61 year old male presents the office today for evaluation of right great toe pain. Patient experiences redness and swelling with pain around the nailbed he states. There is a new complaint is been going on intermittently for several months however limits had a recent flareup for the past day. Patient thinks that this is due to the respective toenail. Patient denies trauma. Patient presents today for further treatment and evaluation   Physical Exam: General: The patient is alert and oriented x3 in no acute distress.  Dermatology: Thickened discolored dystrophic toenail noted to the right great toe. There does appear to be some irritation surrounding the borders of the nail plate. There is some localized cellulitis surrounding the right great toe as well. Skin is warm, dry and supple bilateral lower extremities. Negative for open lesions or macerations.  Vascular: Palpable pedal pulses bilaterally. Mild erythema likely secondary to localized cellulitis of the right great toe  Neurological: Epicritic and protective threshold grossly intact bilaterally.   Musculoskeletal Exam: Range of motion within normal limits to all pedal and ankle joints bilateral. Muscle strength 5/5 in all groups bilateral.    Assessment: 1. Painful dystrophic toenail right great toe 2. Cellulitis right great toe   Plan of Care:  1. Patient was evaluated. 2. Today I discussed the conservative versus procedural management of the toenail. The patient would like to permanently remove the toenail. All possible complications and details were explained. Prior to permanent toenail avulsion procedure 3 mL of local anesthesia infiltration was utilized and hallux block fashion. The toe was prepped in an aseptic manner and total permanent nail avulsion was performed with 330 seconds applications of phenol followed by alcohol flush. 3. Dry sterile dressings were applied. Post procedure care instructions were provided 4.  Prescription for Augmentin #14 5. Return to clinic in 2 weeks   Felecia ShellingBrent M. Evans, DPM [facility]  Dr. Felecia ShellingBrent M. Evans, DPM    [address]                                        [place], [place] [address]                Office [phone]  Fax [phone]

## 2017-07-20 DIAGNOSIS — Z1211 Encounter for screening for malignant neoplasm of colon: Secondary | ICD-10-CM | POA: Insufficient documentation

## 2017-08-01 ENCOUNTER — Ambulatory Visit: Payer: Medicare Other | Admitting: Podiatry

## 2017-08-03 ENCOUNTER — Telehealth: Payer: Self-pay | Admitting: Podiatry

## 2017-08-03 NOTE — Telephone Encounter (Signed)
This is Geoffrey Cruz. I'm a patient of Dr. Geryl Rankins (pt has been seeing Dr. Logan Bores lately though). I know this is my second message today. I have some questions. 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 Should I be wearing my compression socks and my right toe is almost purple. Please call me back at [phone].

## 2017-08-03 NOTE — Telephone Encounter (Signed)
I have a question if you could please call me back at [phone]. Thank you.

## 2017-08-03 NOTE — Telephone Encounter (Signed)
I spoke with patient, he states that his right great toe has become red, almost purple, stinging and burning in the last 2-3 days.  He denies any drainage, odor or fever.  I informed him to start soaking in Epson salt and water, use antibiotic cream and he will come in Friday to see Dr. Logan BoresEvans.  He verbally understood that if his sysmptoms get worse before Friday, that he will go to urgent care or ED.

## 2017-08-05 ENCOUNTER — Ambulatory Visit (INDEPENDENT_AMBULATORY_CARE_PROVIDER_SITE_OTHER): Payer: Medicare Other | Admitting: Podiatry

## 2017-08-05 DIAGNOSIS — L600 Ingrowing nail: Secondary | ICD-10-CM

## 2017-08-05 MED ORDER — GENTAMICIN SULFATE 0.1 % EX CREA
1.0000 | TOPICAL_CREAM | Freq: Three times a day (TID) | CUTANEOUS | 1 refills | Status: DC
Start: 2017-08-05 — End: 2017-10-26

## 2017-08-11 ENCOUNTER — Ambulatory Visit (INDEPENDENT_AMBULATORY_CARE_PROVIDER_SITE_OTHER): Payer: Medicare Other | Admitting: Podiatry

## 2017-08-11 DIAGNOSIS — M79676 Pain in unspecified toe(s): Secondary | ICD-10-CM | POA: Diagnosis not present

## 2017-08-11 DIAGNOSIS — B351 Tinea unguium: Secondary | ICD-10-CM

## 2017-08-11 NOTE — Progress Notes (Signed)
Complaint:  Visit Type: Patient returns to my office for continued preventative foot care services. 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 The patient presents for preventative foot care services. No changes to ROS.  Heels have improved from previous visit.  Painful callus left heel.  Podiatric Exam: Vascular: dorsalis pedis and posterior tibial pulses are palpable bilateral. Capillary return is immediate. Temperature gradient is WNL. Skin turgor WNL  Sensorium: Diminished Semmes Weinstein monofilament test. Normal tactile sensation bilaterally. Nail Exam: Pt has thick disfigured discolored nails with subungual debris noted bilateral entire nail hallux through fifth toenails Ulcer Exam: There is no evidence of ulcer or pre-ulcerative changes or infection. Orthopedic Exam: Muscle tone and strength are WNL. No limitations in general ROM. No crepitus or effusions noted. Foot type and digits show no abnormalities. Bony prominences are unremarkable. Skin: No Porokeratosis. No infection or ulcers  Diagnosis:  Onychomycosis, , Pain in right toe, pain in left toes  Treatment & Plan Procedures and Treatment: Consent by patient was obtained for treatment procedures. The patient understood the discussion of treatment and procedures well. All questions were answered thoroughly reviewed. Debridement of mycotic and hypertrophic toenails, 1 through 5 bilateral and clearing of subungual debris. No ulceration, no infection noted. Return Visit-Office Procedure: Patient instructed to return to the office for a follow up visit 3 months for continued evaluation and treatment.    Helane GuntherGregory Lamontae Ricardo DPM

## 2017-08-13 NOTE — Progress Notes (Signed)
   Subjective: Patient presents today for 4 week follow-up for total nail avulsion to the right great toe. Patient states that it's doing much better. Patient still having a little bit of redness with burning sensations however he states that he is much better. He is otherwise satisfied with no new complaints.  Objective: Skin is warm, dry and supple. Open wound noted to the right great toenail avulsion site with minimal fibrotic tissue. There is no drainage. Periungual nail folds are mildly erythematous likely due to phenol reaction. There is no malodor.  Assessment: #1 postop permanent total nail avulsion right great toe #2 open wound nail bed of respective digit.   Plan of care: #1 patient was evaluated  #2 debridement of open wound was performed to the nailbed of the respective toe using a tissue nipper. Antibiotic ointment and Band-Aid was applied. #3 prescription for gentamicin cream to be applied to the avulsion site daily.  #4 patient is to return to clinic on a PRN  basis.   Felecia ShellingBrent M. Kadra Kohan, DPM [facility]  Dr. Felecia ShellingBrent M. Deniro Laymon, DPM    [address]                                        [place], [place] [address]                Office [phone]  Fax [phone]

## 2017-08-30 ENCOUNTER — Ambulatory Visit
Admission: RE | Admit: 2017-08-30 | Discharge: 2017-08-30 | Disposition: A | Discharge status: Home / Self Care | Payer: Medicare Other | Source: Ambulatory Visit | Attending: Physician Assistant | Admitting: Physician Assistant

## 2017-08-30 ENCOUNTER — Other Ambulatory Visit: Payer: Self-pay | Admitting: Physician Assistant

## 2017-08-30 DIAGNOSIS — R109 Unspecified abdominal pain: Secondary | ICD-10-CM | POA: Diagnosis present

## 2017-08-30 DIAGNOSIS — K4090 Unilateral inguinal hernia, without obstruction or gangrene, not specified as recurrent: Secondary | ICD-10-CM | POA: Insufficient documentation

## 2017-08-30 DIAGNOSIS — I700 Atherosclerosis of aorta: Secondary | ICD-10-CM | POA: Insufficient documentation

## 2017-08-30 DIAGNOSIS — K5730 Diverticulosis of large intestine without perforation or abscess without bleeding: Secondary | ICD-10-CM | POA: Insufficient documentation

## 2017-08-30 MED ORDER — IOPAMIDOL (ISOVUE-370) INJECTION 76%
100.0000 mL | Freq: Once | INTRAVENOUS | Status: AC | PRN
Start: 2017-08-30 — End: 2017-08-30
  Administered 2017-08-30: 100 mL via INTRAVENOUS

## 2017-09-05 DIAGNOSIS — K439 Ventral hernia without obstruction or gangrene: Secondary | ICD-10-CM | POA: Insufficient documentation

## 2017-09-05 DIAGNOSIS — R1031 Right lower quadrant pain: Secondary | ICD-10-CM | POA: Insufficient documentation

## 2017-10-26 ENCOUNTER — Encounter: Payer: Medicare Other | Attending: Infectious Diseases | Admitting: Dietician

## 2017-10-26 ENCOUNTER — Encounter: Payer: Self-pay | Admitting: Dietician

## 2017-10-26 VITALS — Ht 72.0 in | Wt 294.9 lb

## 2017-10-26 DIAGNOSIS — Z6839 Body mass index (BMI) 39.0-39.9, adult: Secondary | ICD-10-CM | POA: Diagnosis not present

## 2017-10-26 DIAGNOSIS — R739 Hyperglycemia, unspecified: Secondary | ICD-10-CM | POA: Insufficient documentation

## 2017-10-26 DIAGNOSIS — Z6841 Body Mass Index (BMI) 40.0 and over, adult: Secondary | ICD-10-CM

## 2017-10-26 DIAGNOSIS — E039 Hypothyroidism, unspecified: Secondary | ICD-10-CM

## 2017-10-26 NOTE — Patient Instructions (Signed)
   Keep a diary of what and how much you eat most or all days. Use measuring cups to measure food portions.   Keep up your walking, great job! Adding some distance or a second walk in the day will help burn more calories.   Decrease sugar in tea -- try 1/2-sweet tea or use splenda (sucralose) in tea.  Choose baked and grilled foods, avoid deep fried foods with breading/ crust.

## 2017-10-26 NOTE — Progress Notes (Signed)
Medical Nutrition Therapy: Visit start time: 6962  end time: 1150  Assessment:  Diagnosis: obesity, pre-diabetes Past medical history: sleep apnea, hypothryoidism Psychosocial issues/ stress concerns: schizophrenia with bipolar, anxiety, depression Preferred learning method:  . Auditory . Visual   Current weight: 294.9lbs  Height: 6'0" Medications, supplements: reconciled list in medical record  Progress and evaluation: Patient reports about 20lb increase in weight since having back surgery about 2 years ago, weight has been stable recently. Has increased protein intake to begin working on weight loss. Reports HbA1C of 5.6%. He reports limiting intake of sweets and has decreased the amount of sugar in tea. He does not plan ahead for meals typically, but does prepare his own meals most of the time, his mother cooks sometimes.   Physical activity: walking 15 minutes daily (1/2 mile)  Dietary Intake:  Usual eating pattern includes 3 meals and 2-3 snacks per day. Dining out frequency: 5 meals per week.  Breakfast: 5am eggs, vegetables, or Great Grains cereal Snack: bananas, sometimes sweets but tries to limit Lunch: 12pm sandwiches- bologna, pimento cheese, or peanut butter Snack: belvita cookies or same as am Supper: 5pm cabbage, sausage, mac and cheese 11/20; fried fish or shrimp and fries  Snack: sometimes cereal or sandwich or burger and fries Beverages: water, 1c coffee in am, sweet tea (2/3c) several times daily, diet coke 1-2 daily  Nutrition Care Education: Topics covered: weight control, diabetes prevention Basic nutrition: basic food groups, appropriate nutrient balance, appropriate meal and snack schedule, general nutrition guidelines    Weight control: benefits of weight control, importance of low fat and low sugar food choices; role of fiber including whole grains, vegetables and fruits, beans, nuts; portion control; basic meal planning using plate method and food models;  benefits of tracking food intake; role of exercise; wrote personalized menus. Hyperglycemia: appropriate meal and snack schedule, appropriate carb intake and balance, role of exercise  Nutritional Diagnosis:  [place]-2.2 Altered nutrition-related laboratory As related to history of hyperglycemia.  As evidenced by HbA1C 5.6%, patient report. [place]-3.3 Overweight/obesity As related to excess calories, medications, low activity level.  As evidenced by BMI 41, patient report.  Intervention: Instruction as noted above.   Set goals with input from patient.    He has support from a nephew who has lost weight recently.  Education Materials given:  . Plate Planner with food lists . Sample menus -- personalized . Goals/ instructions  Learner/ who was taught:  . Patient   Level of understanding: Marland Kitchen Verbalizes/ demonstrates competency  Demonstrated degree of understanding via:   Teach back Learning barriers: . None  Willingness to learn/ readiness for change: . Eager, change in progress  Monitoring and Evaluation:  Dietary intake, exercise, BG control, and body weight      follow up: 12/05/17

## 2017-11-10 ENCOUNTER — Encounter: Payer: Self-pay | Admitting: Podiatry

## 2017-11-10 ENCOUNTER — Ambulatory Visit (INDEPENDENT_AMBULATORY_CARE_PROVIDER_SITE_OTHER): Payer: Medicare Other | Admitting: Podiatry

## 2017-11-10 DIAGNOSIS — M79676 Pain in unspecified toe(s): Secondary | ICD-10-CM

## 2017-11-10 DIAGNOSIS — B351 Tinea unguium: Secondary | ICD-10-CM

## 2017-11-10 NOTE — Progress Notes (Signed)
Complaint:  Visit Type: Patient returns to my office for continued preventative foot care services. 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 The patient presents for preventative foot care services. No changes to ROS.  Heels have improved from previous visit.  No heel pain today.  Podiatric Exam: Vascular: dorsalis pedis and posterior tibial pulses are palpable bilateral. Capillary return is immediate. Temperature gradient is WNL. Skin turgor WNL  Sensorium: Diminished Semmes Weinstein monofilament test. Normal tactile sensation bilaterally. Nail Exam: Pt has thick disfigured discolored nails with subungual debris noted bilateral entire nail hallux through fifth toenails Ulcer Exam: There is no evidence of ulcer or pre-ulcerative changes or infection. Orthopedic Exam: Muscle tone and strength are WNL. No limitations in general ROM. No crepitus or effusions noted. Foot type and digits show no abnormalities. Bony prominences are unremarkable. Skin: No Porokeratosis. No infection or ulcers  Diagnosis:  Onychomycosis, , Pain in right toe, pain in left toes  Treatment & Plan Procedures and Treatment: Consent by patient was obtained for treatment procedures. The patient understood the discussion of treatment and procedures well. All questions were answered thoroughly reviewed. Debridement of mycotic and hypertrophic toenails, 1 through 5 bilateral and clearing of subungual debris. No ulceration, no infection noted. Upon debridement there was laceration at nail/nail bed third toe left. Neosporin/DSD was applied. Return Visit-Office Procedure: Patient instructed to return to the office for a follow up visit 3 months for continued evaluation and treatment.    Helane GuntherGregory Jamaree Hosier DPM

## 2017-12-05 ENCOUNTER — Ambulatory Visit: Payer: Medicare Other | Admitting: Dietician

## 2017-12-29 ENCOUNTER — Encounter: Payer: Self-pay | Admitting: Dietician

## 2017-12-29 NOTE — Progress Notes (Signed)
Have not heard back from patient to reschedule his cancelled appointment from 12/05/18. Sent discharge letter to referring provider.

## 2018-02-09 ENCOUNTER — Encounter: Payer: Self-pay | Admitting: Podiatry

## 2018-02-09 ENCOUNTER — Ambulatory Visit (INDEPENDENT_AMBULATORY_CARE_PROVIDER_SITE_OTHER): Payer: Medicare Other | Admitting: Podiatry

## 2018-02-09 DIAGNOSIS — M79676 Pain in unspecified toe(s): Secondary | ICD-10-CM | POA: Diagnosis not present

## 2018-02-09 DIAGNOSIS — B351 Tinea unguium: Secondary | ICD-10-CM

## 2018-02-09 NOTE — Progress Notes (Signed)
Complaint:  Visit Type: Patient returns to my office for continued preventative foot care services. 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 The patient presents for preventative foot care services. No changes to ROS.    Podiatric Exam: Vascular: dorsalis pedis and posterior tibial pulses are palpable bilateral. Capillary return is immediate. Temperature gradient is WNL. Skin turgor WNL  Sensorium: Diminished Semmes Weinstein monofilament test. Normal tactile sensation bilaterally. Nail Exam: Pt has thick disfigured discolored nails with subungual debris noted bilateral entire nail hallux through fifth toenails Ulcer Exam: There is no evidence of ulcer or pre-ulcerative changes or infection. Orthopedic Exam: Muscle tone and strength are WNL. No limitations in general ROM. No crepitus or effusions noted. Foot type and digits show no abnormalities. Bony prominences are unremarkable. Skin: No Porokeratosis. No infection or ulcers.  Callus noted heels  B/L  Diagnosis:  Onychomycosis, , Pain in right toe, pain in left toes  Treatment & Plan Procedures and Treatment: Consent by patient was obtained for treatment procedures. The patient understood the discussion of treatment and procedures well. All questions were answered thoroughly reviewed. Debridement of mycotic and hypertrophic toenails, 1 through 5 bilateral and clearing of subungual debris. No ulceration, no infection noted.  Return Visit-Office Procedure: Patient instructed to return to the office for a follow up visit 3 months for continued evaluation and treatment.    Helane GuntherGregory Zain Bingman DPM

## 2018-03-06 ENCOUNTER — Ambulatory Visit: Payer: Medicare Other | Admitting: Podiatry

## 2018-04-13 IMAGING — CT CT ABD-PELV W/ CM
1 of 3 series · 13 of 32 positions shown, 18 images · IV contrast (isovue)
Comparison: None

CLINICAL DATA: RIGHT groin and RIGHT lower quadrant pain with
swelling and bulge for 2 weeks, no known injury

EXAM:
CT ABDOMEN AND PELVIS WITH CONTRAST
TECHNIQUE: Multidetector CT imaging of the abdomen and pelvis was performed
using the standard protocol following bolus administration of
intravenous contrast. Sagittal and coronal MPR images reconstructed
from axial data set.
CONTRAST:  100 cc Isovue 370 IV.  Dilute oral contrast.

[Series 2: axial st · axial · 0.92mm/px · z∈[-1014,-529]mm · 13 of 111 slices shown, 18 images]
[im 7/111  soft-tissue]
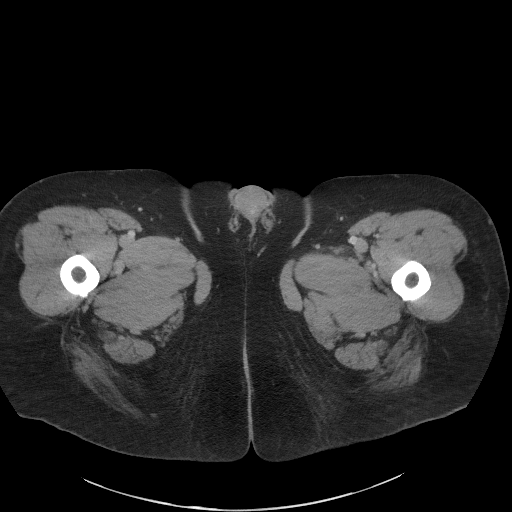
[im 7/111  bone]
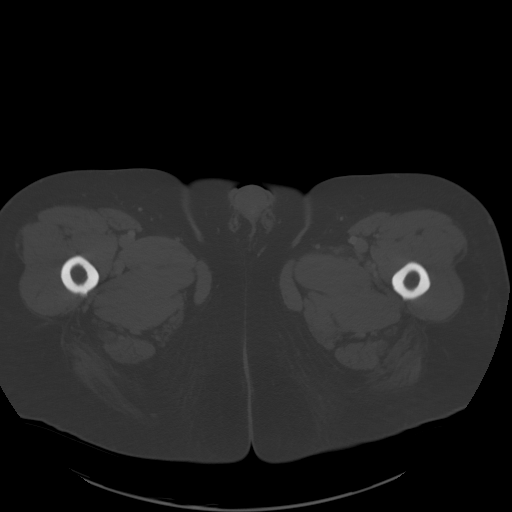
[im 19/111  soft-tissue]
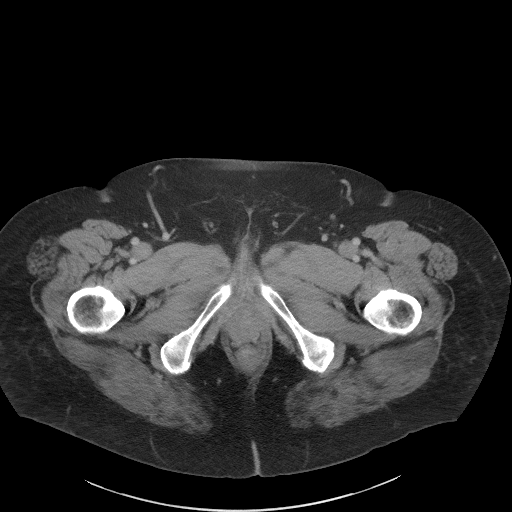
[im 25/111  soft-tissue]
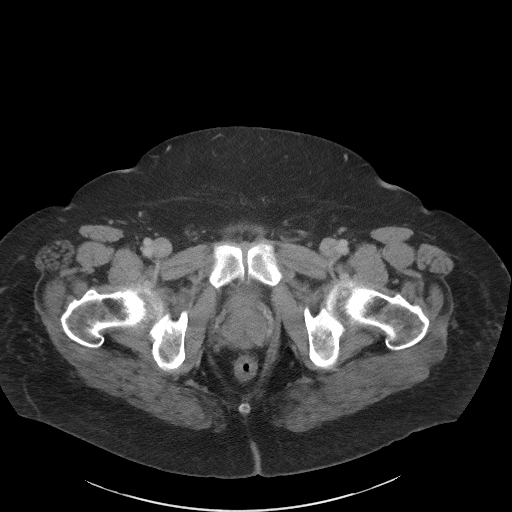
[im 31/111  soft-tissue]
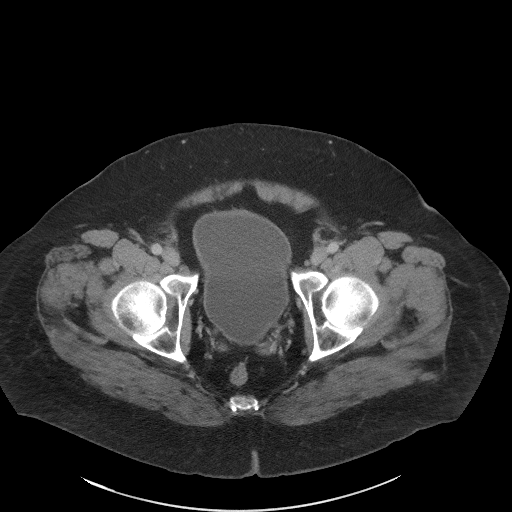
[im 43/111  soft-tissue]
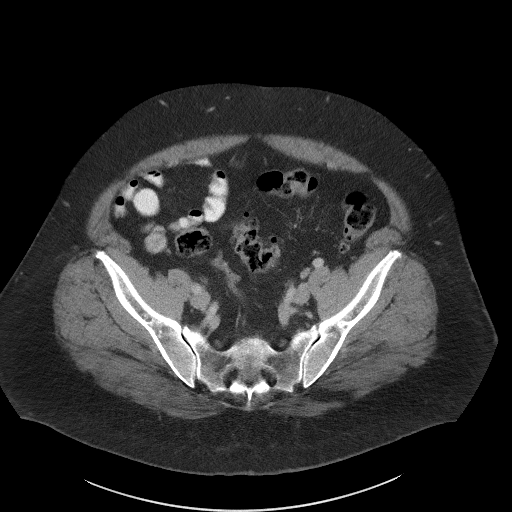
[im 49/111  soft-tissue]
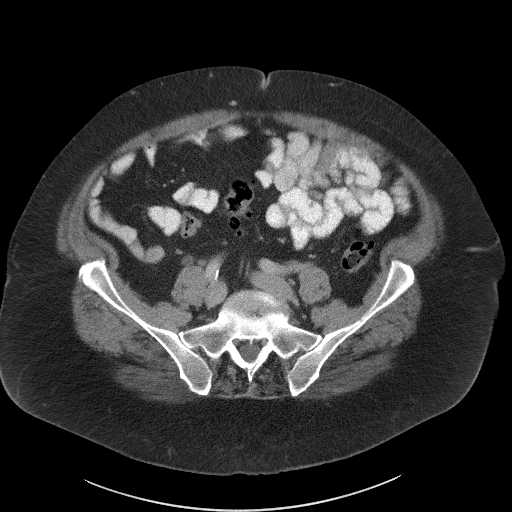
[im 62/111  soft-tissue]
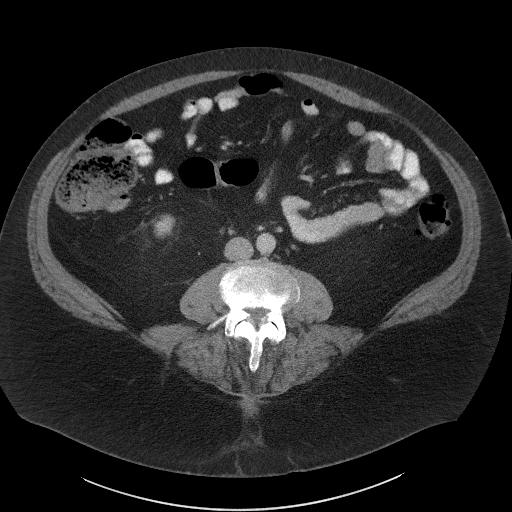
[im 68/111  soft-tissue]
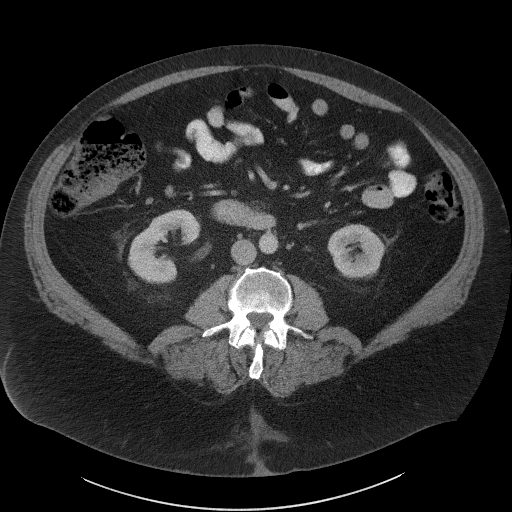
[im 80/111  soft-tissue]
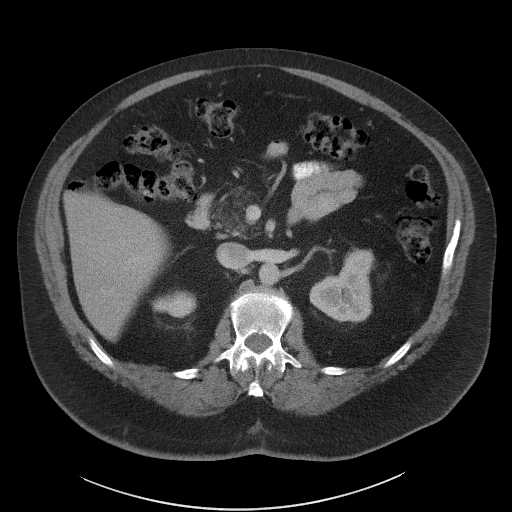
[im 80/111  bone]
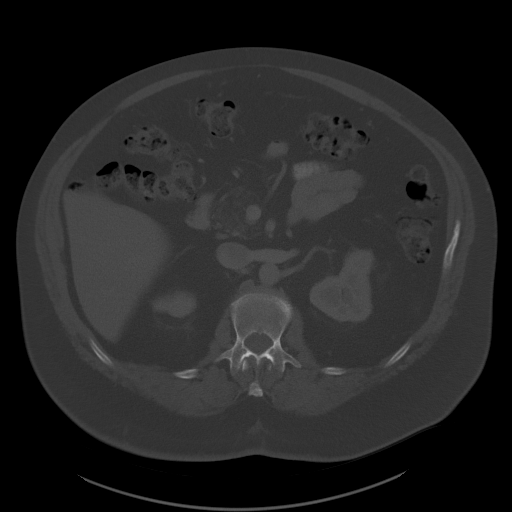
[im 86/111  soft-tissue]
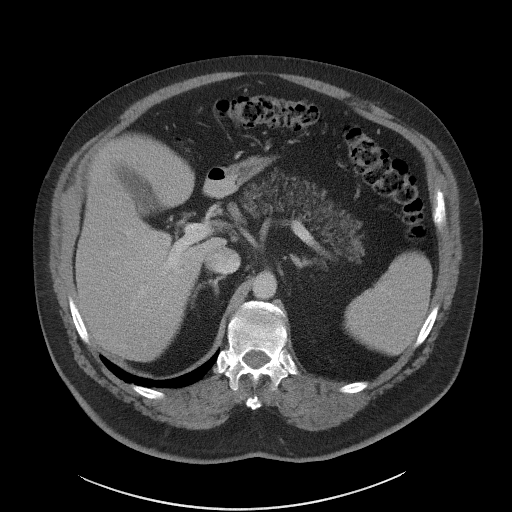
[im 86/111  lung]
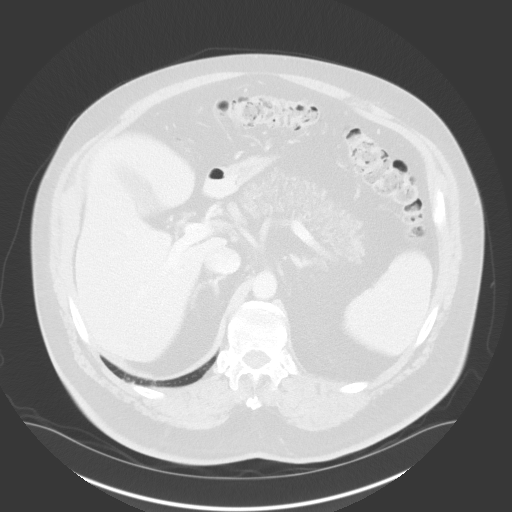
[im 92/111  soft-tissue]
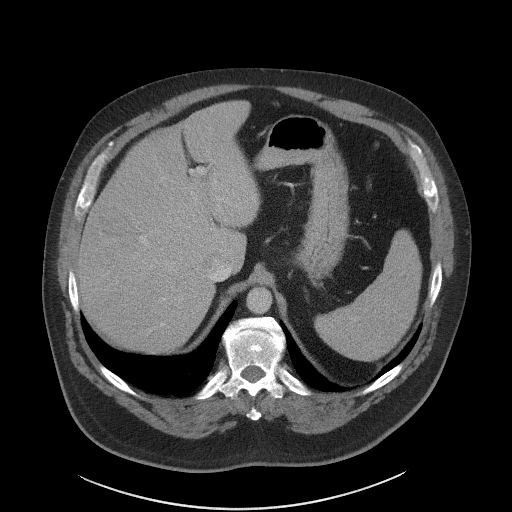
[im 92/111  lung]
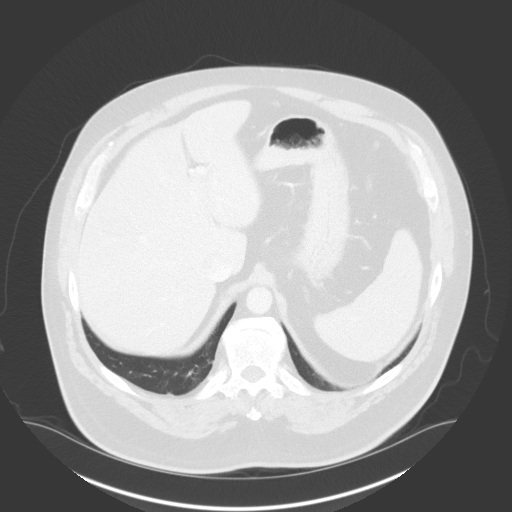
[im 98/111  lung]
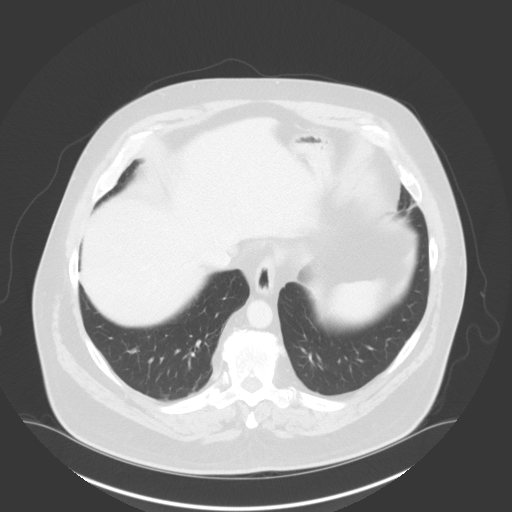
[im 104/111  soft-tissue]
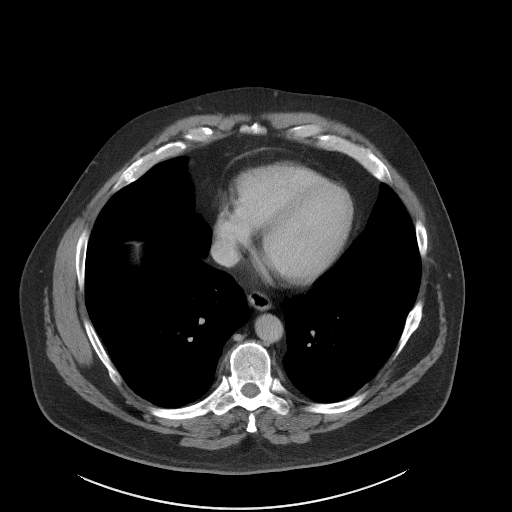
[im 104/111  lung]
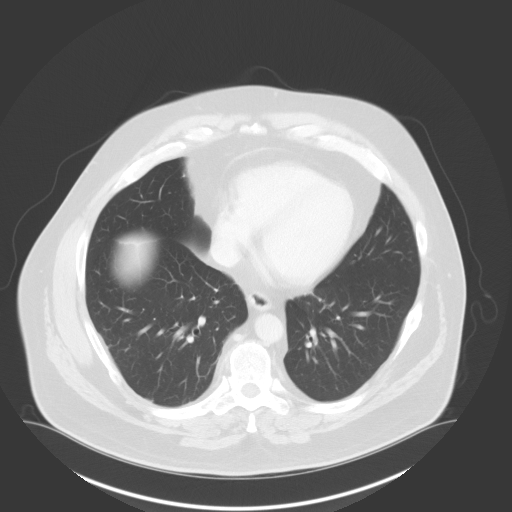

[13 of 32 positions shown; findings below may reference images not displayed]

FINDINGS: Lower chest: Lung bases clear

Hepatobiliary: Gallbladder and liver unremarkable

Pancreas: Partial fatty replacement of pancreas. No focal pancreatic
mass or calcification.

Spleen: Normal appearance

Adrenals/Urinary Tract: Adrenal glands normal appearance. Minimal
nonspecific stranding and perinephric fat bilaterally. No renal
mass, hydronephrosis, hydroureter, or urinary tract calcification.
Bladder unremarkable.

Stomach/Bowel: Normal appendix. Sigmoid diverticulosis without
evidence of diverticulitis. Stomach decompressed. Bowel loops
otherwise normal.

Vascular/Lymphatic: Aorta normal caliber. Single tiny calcified
plaque at the distal abdominal aorta. No adenopathy.

Reproductive: Unremarkable prostate gland and seminal vesicles.

Other: Very small LEFT inguinal hernia containing fat. No RIGHT
inguinal or femoral hernia identified. No free air or free fluid.
Ventral fascial planes appear intact without ventral hernia. No
cause for the RIGHT growing bulge is seen.

Musculoskeletal: Large Schmorl's node at superior endplate of L5
with minimal superior endplate compression deformity of L5 vertebral
body, appears old. Grade 1 anterolisthesis at L4-L5 secondary to
degenerative disc and facet disease changes. Spinal stenosis and
LEFT lateral recess stenosis at L4-L5 due to facet hypertrophy.
IMPRESSION: Small LEFT inguinal hernia containing fat.

Sigmoid diverticulosis without evidence of diverticulitis.

No other intra-abdominal or intrapelvic abnormalities.

Specifically, no close for RIGHT lower quadrant/RIGHT groin pain
identified.

Aortic Atherosclerosis ([id number]).

## 2018-05-11 ENCOUNTER — Encounter: Payer: Self-pay | Admitting: Podiatry

## 2018-05-11 ENCOUNTER — Ambulatory Visit (INDEPENDENT_AMBULATORY_CARE_PROVIDER_SITE_OTHER): Payer: Medicare Other | Admitting: Podiatry

## 2018-05-11 DIAGNOSIS — M79676 Pain in unspecified toe(s): Secondary | ICD-10-CM | POA: Diagnosis not present

## 2018-05-11 DIAGNOSIS — L600 Ingrowing nail: Secondary | ICD-10-CM

## 2018-05-11 DIAGNOSIS — B351 Tinea unguium: Secondary | ICD-10-CM | POA: Diagnosis not present

## 2018-05-11 NOTE — Progress Notes (Signed)
Complaint:  Visit Type: Patient returns to my office for continued preventative foot care services. 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 The patient presents for preventative foot care services. No changes to ROS.     Podiatric Exam: Vascular: dorsalis pedis and posterior tibial pulses are palpable bilateral. Capillary return is immediate. Temperature gradient is WNL. Skin turgor WNL  Sensorium: Diminished Semmes Weinstein monofilament test. Normal tactile sensation bilaterally. Nail Exam: Pt has thick disfigured discolored nails with subungual debris noted bilateral entire nail hallux through fifth toenails Ulcer Exam: There is no evidence of ulcer or pre-ulcerative changes or infection. Orthopedic Exam: Muscle tone and strength are WNL. No limitations in general ROM. No crepitus or effusions noted. Foot type and digits show no abnormalities. Hallux malleus left.  Hammer toe/mallet toe second  B/L. Skin: No Porokeratosis. No infection or ulcers.  Callus noted heels  B/L.  Diagnosis:  Onychomycosis, , Pain in right toe, pain in left toes  Treatment & Plan Procedures and Treatment: Consent by patient was obtained for treatment procedures. The patient understood the discussion of treatment and procedures well. All questions were answered thoroughly reviewed. Debridement of mycotic and hypertrophic toenails, 1 through 5 bilateral and clearing of subungual debris. No ulceration, no infection noted.   Return Visit-Office Procedure: Patient instructed to return to the office for a follow up visit 3 months for continued evaluation and treatment.    Cyan Moultrie DPM 

## 2018-08-14 ENCOUNTER — Encounter: Payer: Self-pay | Admitting: Podiatry

## 2018-08-14 ENCOUNTER — Ambulatory Visit (INDEPENDENT_AMBULATORY_CARE_PROVIDER_SITE_OTHER): Payer: Medicare Other | Admitting: Podiatry

## 2018-08-14 DIAGNOSIS — M79676 Pain in unspecified toe(s): Secondary | ICD-10-CM

## 2018-08-14 DIAGNOSIS — B351 Tinea unguium: Secondary | ICD-10-CM | POA: Diagnosis not present

## 2018-08-14 NOTE — Progress Notes (Signed)
Complaint:  Visit Type: Patient returns to my office for continued preventative foot care services. 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 The patient presents for preventative foot care services. No changes to ROS.     Podiatric Exam: Vascular: dorsalis pedis and posterior tibial pulses are palpable bilateral. Capillary return is immediate. Temperature gradient is WNL. Skin turgor WNL  Sensorium: Diminished Semmes Weinstein monofilament test. Normal tactile sensation bilaterally. Nail Exam: Pt has thick disfigured discolored nails with subungual debris noted bilateral entire nail hallux through fifth toenails Ulcer Exam: There is no evidence of ulcer or pre-ulcerative changes or infection. Orthopedic Exam: Muscle tone and strength are WNL. No limitations in general ROM. No crepitus or effusions noted. Foot type and digits show no abnormalities. Hallux malleus left.  Hammer toe/mallet toe second  B/L. Skin: No Porokeratosis. No infection or ulcers.  Callus noted heels  B/L.  Diagnosis:  Onychomycosis, , Pain in right toe, pain in left toes  Treatment & Plan Procedures and Treatment: Consent by patient was obtained for treatment procedures. The patient understood the discussion of treatment and procedures well. All questions were answered thoroughly reviewed. Debridement of mycotic and hypertrophic toenails, 1 through 5 bilateral and clearing of subungual debris. No ulceration, no infection noted.   Return Visit-Office Procedure: Patient instructed to return to the office for a follow up visit 3 months for continued evaluation and treatment.    Janaia Kozel DPM 

## 2018-11-13 ENCOUNTER — Encounter: Payer: Self-pay | Admitting: Podiatry

## 2018-11-13 ENCOUNTER — Ambulatory Visit (INDEPENDENT_AMBULATORY_CARE_PROVIDER_SITE_OTHER): Payer: Medicare Other | Admitting: Podiatry

## 2018-11-13 DIAGNOSIS — G629 Polyneuropathy, unspecified: Secondary | ICD-10-CM

## 2018-11-13 DIAGNOSIS — M79676 Pain in unspecified toe(s): Secondary | ICD-10-CM

## 2018-11-13 DIAGNOSIS — B351 Tinea unguium: Secondary | ICD-10-CM

## 2018-11-13 NOTE — Progress Notes (Signed)
Complaint:  Visit Type: Patient returns to my office for continued preventative foot care services. 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 The patient presents for preventative foot care services. No changes to ROS.     Podiatric Exam: Vascular: dorsalis pedis and posterior tibial pulses are palpable bilateral. Capillary return is immediate. Temperature gradient is WNL. Skin turgor WNL  Sensorium: Diminished Semmes Weinstein monofilament test. Normal tactile sensation bilaterally. Nail Exam: Pt has thick disfigured discolored nails with subungual debris noted bilateral entire nail hallux through fifth toenails Ulcer Exam: There is no evidence of ulcer or pre-ulcerative changes or infection. Orthopedic Exam: Muscle tone and strength are WNL. No limitations in general ROM. No crepitus or effusions noted. Foot type and digits show no abnormalities. Hallux malleus left.  Hammer toe/mallet toe second  B/L. Skin: No Porokeratosis. No infection or ulcers.  Callus noted heels  B/L.  Diagnosis:  Onychomycosis, , Pain in right toe, pain in left toes  Treatment & Plan Procedures and Treatment: Consent by patient was obtained for treatment procedures. The patient understood the discussion of treatment and procedures well. All questions were answered thoroughly reviewed. Debridement of mycotic and hypertrophic toenails, 1 through 5 bilateral and clearing of subungual debris. No ulceration, no infection noted.   Return Visit-Office Procedure: Patient instructed to return to the office for a follow up visit 3 months for continued evaluation and treatment.    Firmin Belisle DPM 

## 2018-12-18 ENCOUNTER — Encounter: Payer: Self-pay | Admitting: Student

## 2018-12-19 ENCOUNTER — Ambulatory Visit: Payer: Medicare Other | Admitting: Anesthesiology

## 2018-12-19 ENCOUNTER — Encounter
Admission: RE | Disposition: A | Discharge status: Home / Self Care | Payer: Self-pay | Source: Home / Self Care | Attending: Internal Medicine

## 2018-12-19 ENCOUNTER — Ambulatory Visit
Admission: RE | Admit: 2018-12-19 | Discharge: 2018-12-19 | Disposition: A | Discharge status: Home / Self Care | Payer: Medicare Other | Attending: Internal Medicine | Admitting: Internal Medicine

## 2018-12-19 DIAGNOSIS — Z7951 Long term (current) use of inhaled steroids: Secondary | ICD-10-CM | POA: Insufficient documentation

## 2018-12-19 DIAGNOSIS — K640 First degree hemorrhoids: Secondary | ICD-10-CM | POA: Diagnosis not present

## 2018-12-19 DIAGNOSIS — Z86010 Personal history of colonic polyps: Secondary | ICD-10-CM | POA: Diagnosis not present

## 2018-12-19 DIAGNOSIS — Z09 Encounter for follow-up examination after completed treatment for conditions other than malignant neoplasm: Secondary | ICD-10-CM | POA: Insufficient documentation

## 2018-12-19 DIAGNOSIS — E039 Hypothyroidism, unspecified: Secondary | ICD-10-CM | POA: Diagnosis not present

## 2018-12-19 DIAGNOSIS — G4730 Sleep apnea, unspecified: Secondary | ICD-10-CM | POA: Diagnosis not present

## 2018-12-19 DIAGNOSIS — Z79890 Hormone replacement therapy (postmenopausal): Secondary | ICD-10-CM | POA: Diagnosis not present

## 2018-12-19 DIAGNOSIS — Z87891 Personal history of nicotine dependence: Secondary | ICD-10-CM | POA: Diagnosis not present

## 2018-12-19 DIAGNOSIS — F329 Major depressive disorder, single episode, unspecified: Secondary | ICD-10-CM | POA: Diagnosis not present

## 2018-12-19 DIAGNOSIS — F419 Anxiety disorder, unspecified: Secondary | ICD-10-CM | POA: Diagnosis not present

## 2018-12-19 DIAGNOSIS — Z9989 Dependence on other enabling machines and devices: Secondary | ICD-10-CM | POA: Diagnosis not present

## 2018-12-19 DIAGNOSIS — Z800 Family history of malignant neoplasm of digestive organs: Secondary | ICD-10-CM | POA: Diagnosis not present

## 2018-12-19 DIAGNOSIS — K5730 Diverticulosis of large intestine without perforation or abscess without bleeding: Secondary | ICD-10-CM | POA: Diagnosis not present

## 2018-12-19 DIAGNOSIS — Z79899 Other long term (current) drug therapy: Secondary | ICD-10-CM | POA: Diagnosis not present

## 2018-12-19 DIAGNOSIS — J449 Chronic obstructive pulmonary disease, unspecified: Secondary | ICD-10-CM | POA: Insufficient documentation

## 2018-12-19 DIAGNOSIS — Z7982 Long term (current) use of aspirin: Secondary | ICD-10-CM | POA: Diagnosis not present

## 2018-12-19 HISTORY — DX: Schizophrenia, unspecified: F20.9

## 2018-12-19 HISTORY — PX: COLONOSCOPY WITH PROPOFOL: SHX5780

## 2018-12-19 HISTORY — DX: Hyperlipidemia, unspecified: E78.5

## 2018-12-19 HISTORY — DX: Bipolar disorder, unspecified: F31.9

## 2018-12-19 HISTORY — DX: Psoriasis, unspecified: L40.9

## 2018-12-19 HISTORY — DX: Personal history of other diseases of male genital organs: Z87.438

## 2018-12-19 HISTORY — DX: Sleep apnea, unspecified: G47.30

## 2018-12-19 HISTORY — DX: Testicular hypofunction: E29.1

## 2018-12-19 HISTORY — DX: Hypothyroidism, unspecified: E03.9

## 2018-12-19 SURGERY — COLONOSCOPY WITH PROPOFOL
Anesthesia: General

## 2018-12-19 MED ORDER — PROPOFOL 500 MG/50ML IV EMUL
INTRAVENOUS | Status: DC | PRN
  Administered 2018-12-19: 175 ug/kg/min via INTRAVENOUS

## 2018-12-19 MED ORDER — PROPOFOL 500 MG/50ML IV EMUL
INTRAVENOUS | Status: AC
  Filled 2018-12-19: qty 50

## 2018-12-19 MED ORDER — LIDOCAINE HCL (PF) 1 % IJ SOLN
2.0000 mL | Freq: Once | INTRAMUSCULAR | Status: AC
  Administered 2018-12-19: 0.3 mL via INTRADERMAL

## 2018-12-19 MED ORDER — SODIUM CHLORIDE 0.9 % IV SOLN
INTRAVENOUS | Status: DC
  Administered 2018-12-19: 1000 mL via INTRAVENOUS

## 2018-12-19 MED ORDER — PROPOFOL 10 MG/ML IV BOLUS
INTRAVENOUS | Status: DC | PRN
  Administered 2018-12-19: 70 mg via INTRAVENOUS

## 2018-12-19 MED ORDER — LIDOCAINE HCL (CARDIAC) PF 100 MG/5ML IV SOSY
PREFILLED_SYRINGE | INTRAVENOUS | Status: DC | PRN
  Administered 2018-12-19: 50 mg via INTRAVENOUS

## 2018-12-19 MED ORDER — LIDOCAINE HCL (PF) 1 % IJ SOLN
INTRAMUSCULAR | Status: AC
  Administered 2018-12-19: 0.3 mL via INTRADERMAL
  Filled 2018-12-19: qty 2

## 2018-12-19 NOTE — Transfer of Care (Signed)
Immediate Anesthesia Transfer of Care Note  Patient: Geoffrey Cruz  Procedure(s) Performed: COLONOSCOPY WITH PROPOFOL (N/A )  Patient Location: PACU  Anesthesia Type:General  Level of Consciousness: sedated  Airway & Oxygen Therapy: Patient Spontanous Breathing and Patient connected to nasal cannula oxygen  Post-op Assessment: Report given to RN and Post -op Vital signs reviewed and stable  Post vital signs: Reviewed and stable  Last Vitals:  Vitals Value Taken Time  BP 118/83 12/19/2018 12:49 PM  Temp 36.3 C 12/19/2018 12:48 PM  Pulse 58 12/19/2018 12:50 PM  Resp 17 12/19/2018 12:50 PM  SpO2 95 % 12/19/2018 12:50 PM  Vitals shown include unvalidated device data.  Last Pain:  Vitals:   12/19/18 1248  TempSrc: Tympanic  PainSc: 0-No pain         Complications: No apparent anesthesia complications

## 2018-12-19 NOTE — Anesthesia Post-op Follow-up Note (Signed)
Anesthesia QCDR form completed.        

## 2018-12-19 NOTE — Interval H&P Note (Signed)
History and Physical Interval Note:  12/19/2018 12:15 PM  Geoffrey Cruz  has presented today for surgery, with the diagnosis of Patient history colon polyps, family history colon cancer.  The various methods of treatment have been discussed with the patient and family. After consideration of risks, benefits and other options for treatment, the patient has consented to  Procedure(s): COLONOSCOPY WITH PROPOFOL (N/A) as a surgical intervention .  The patient's history has been reviewed, patient examined, no change in status, stable for surgery.  I have reviewed the patient's chart and labs.  Questions were answered to the patient's satisfaction.     [place], [place]

## 2018-12-19 NOTE — Anesthesia Preprocedure Evaluation (Signed)
Anesthesia Evaluation  Patient identified by MRN, date of birth, ID band Patient awake    Reviewed: Allergy & Precautions, NPO status , Patient's Chart, lab work & pertinent test results  History of Anesthesia Complications Negative for: history of anesthetic complications  Airway Mallampati: II  TM Distance: >3 FB Neck ROM: Full    Dental no notable dental hx.    Pulmonary sleep apnea and Continuous Positive Airway Pressure Ventilation , neg COPD, former smoker,    breath sounds clear to auscultation- rhonchi (-) wheezing      Cardiovascular Exercise Tolerance: Good (-) hypertension(-) Past MI, (-) Cardiac Stents and (-) CABG  Rhythm:Regular Rate:Normal - Systolic murmurs and - Diastolic murmurs    Neuro/Psych neg Seizures PSYCHIATRIC DISORDERS Anxiety Depression Bipolar Disorder Schizophrenia negative neurological ROS     GI/Hepatic negative GI ROS, Neg liver ROS,   Endo/Other  neg diabetesHypothyroidism   Renal/GU negative Renal ROS     Musculoskeletal negative musculoskeletal ROS (+)   Abdominal (+) + obese,   Peds  Hematology negative hematology ROS (+)   Anesthesia Other Findings Past Medical History: No date: Anxiety No date: Bipolar disorder (HCC) No date: Depression No date: History of prostatitis No date: Hyperlipidemia No date: Hypogonadism in male No date: Hypothyroidism No date: Psoriasis No date: Schizophrenia (HCC) No date: Sleep apnea No date: Swelling No date: Thyroid disease   Reproductive/Obstetrics                             Anesthesia Physical Anesthesia Plan  ASA: II  Anesthesia Plan: General   Post-op Pain Management:    Induction: Intravenous  PONV Risk Score and Plan: 1 and Propofol infusion  Airway Management Planned: Natural Airway  Additional Equipment:   Intra-op Plan:   Post-operative Plan:   Informed Consent: I have reviewed the  patients History and Physical, chart, labs and discussed the procedure including the risks, benefits and alternatives for the proposed anesthesia with the patient or authorized representative who has indicated his/her understanding and acceptance.     Dental advisory given  Plan Discussed with: CRNA and Anesthesiologist  Anesthesia Plan Comments:         Anesthesia Quick Evaluation

## 2018-12-19 NOTE — H&P (Signed)
Outpatient short stay form Pre-procedure 12/19/2018 12:14 PM  K. Norma Fredrickson, M.D.  Primary Physician: Marcelino Duster, M.D.  Reason for visit: Personal hx of colon polyps, Family history of colon cancer.   History of present illness:                            Patient presents for colonoscopy for a personal hx of colon polyps. The patient denies abdominal pain, abnormal weight loss or rectal bleeding.     Current Facility-Administered Medications:  .  0.9 %  sodium chloride infusion, , Intravenous, Continuous, [place], Boykin Nearing, MD, Last Rate: 20 mL/hr at 12/19/18 1155  Medications Prior to Admission  Medication Sig Dispense Refill Last Dose  . Acetaminophen 500 MG coapsule Take by mouth.   Taking  . amoxicillin-clavulanate (AUGMENTIN) 875-125 MG tablet   0 Taking  . ARIPiprazole (ABILIFY) 20 MG tablet Take 20 mg by mouth daily.   Taking  . aspirin 81 MG tablet Take 81 mg by mouth daily.   Not Taking at Unknown time  . buPROPion (WELLBUTRIN SR) 200 MG 12 hr tablet Take 200 mg by mouth 2 (two) times daily.   Taking  . clonazePAM (KLONOPIN) 0.5 MG tablet Take by mouth.   Taking  . fluticasone (FLONASE) 50 MCG/ACT nasal spray Place into the nose.   Taking  . fluvoxaMINE (LUVOX) 100 MG tablet Take 100 mg by mouth 2 (two) times daily.   Taking  . furosemide (LASIX) 40 MG tablet Take 40 mg by mouth daily.   Taking  . levothyroxine (SYNTHROID, LEVOTHROID) 88 MCG tablet Take 88 mcg by mouth daily before breakfast.   Taking  . neomycin-polymyxin-hydrocortisone (CORTISPORIN) 3.5-10000-1 OTIC suspension   0 Taking     Allergies  Allergen Reactions  . Hydrocodone-Acetaminophen Swelling    Swelling in ankles  . Propoxyphene Nausea Only  . Codeine Other (See Comments) and Anxiety    NERVOUS FEELING NERVOUS FEELING     Past Medical History:  Diagnosis Date  . Anxiety   . Bipolar disorder (HCC)   . Depression   . History of prostatitis   . Hyperlipidemia   . Hypogonadism in male    . Hypothyroidism   . Psoriasis   . Schizophrenia (HCC)   . Sleep apnea   . Swelling   . Thyroid disease     Review of systems:  Otherwise negative.    Physical Exam  Gen: Alert, oriented. Appears stated age.  HEENT: [place]/AT. PERRLA. Lungs: CTA, no wheezes. CV: RR nl S1, S2. Abd: soft, benign, no masses. BS+ Ext: No edema. Pulses 2+    Planned procedures: Proceed with colonoscopy. The patient understands the nature of the planned procedure, indications, risks, alternatives and potential complications including but not limited to bleeding, infection, perforation, damage to internal organs and possible oversedation/side effects from anesthesia. The patient agrees and gives consent to proceed.  Please refer to procedure notes for findings, recommendations and patient disposition/instructions.      K. Norma Fredrickson, M.D. Gastroenterology 12/19/2018  12:14 PM

## 2018-12-19 NOTE — Op Note (Signed)
[facility] Gastroenterology Patient Name: Geoffrey Cruz Procedure Date: 12/19/2018 12:07 PM MRN: [id number] Account #: [id number] Date of Birth: 1956-03-02 Admit Type: Outpatient Age: 63 Room: [facility] ENDO ROOM 2 Gender: Male Note Status: Finalized Procedure:            Colonoscopy Indications:          Screening in patient at increased risk: Family history                        of 1st-degree relative with colorectal cancer before                        age 59 years, High risk colon cancer surveillance:                        Personal history of colonic polyps Providers:            Boykin Nearing. Norma Fredrickson MD, MD Referring MD:         Gracelyn Nurse, MD (Referring MD) Medicines:            Propofol per Anesthesia Complications:        No immediate complications. Procedure:            Pre-Anesthesia Assessment:                       - The risks and benefits of the procedure and the                        sedation options and risks were discussed with the                        patient. All questions were answered and informed                        consent was obtained.                       - Patient identification and proposed procedure were                        verified prior to the procedure by the nurse. The                        procedure was verified in the procedure room.                       - ASA Grade Assessment: III - A patient with severe                        systemic disease.                       - After reviewing the risks and benefits, the patient                        was deemed in satisfactory condition to undergo the                        procedure.  After obtaining informed consent, the colonoscope was                        passed under direct vision. Throughout the procedure,                        the patient's blood pressure, pulse, and oxygen                        saturations were monitored continuously. The                    Colonoscope was introduced through the anus and                        advanced to the the cecum, identified by appendiceal                        orifice and ileocecal valve. The colonoscopy was                        performed without difficulty. The patient tolerated the                        procedure well. The quality of the bowel preparation                        was good. The ileocecal valve, appendiceal orifice, and                        rectum were photographed. Findings:      The perianal and digital rectal examinations were normal. Pertinent       negatives include normal sphincter tone and no palpable rectal lesions.      Many small-mouthed diverticula were found in the entire colon. There was       no evidence of diverticular bleeding.      Non-bleeding internal hemorrhoids were found during retroflexion. The       hemorrhoids were Grade I (internal hemorrhoids that do not prolapse).      The exam was otherwise without abnormality. Impression:           - Mild diverticulosis in the entire examined colon.                        There was no evidence of diverticular bleeding.                       - Non-bleeding internal hemorrhoids.                       - The examination was otherwise normal.                       - No specimens collected. Recommendation:       - Patient has a contact number available for                        emergencies. The signs and symptoms of potential                        delayed complications were discussed with  the patient.                        Return to normal activities tomorrow. Written discharge                        instructions were provided to the patient.                       - Resume previous diet.                       - Continue present medications.                       - Repeat colonoscopy in 5 years for screening purposes.                       - Return to GI office PRN.                       - The findings  and recommendations were discussed with                        the patient and their family. Procedure Code(s):    --- Professional ---                       [id number], Colorectal cancer screening; colonoscopy on                        individual at high risk Diagnosis Code(s):    --- Professional ---                       K57.30, Diverticulosis of large intestine without                        perforation or abscess without bleeding                       K64.0, First degree hemorrhoids                       Z86.010, Personal history of colonic polyps                       Z80.0, Family history of malignant neoplasm of                        digestive organs CPT copyright 2018 American Medical Association. All rights reserved. The codes documented in this report are preliminary and upon coder review may  be revised to meet current compliance requirements. Stanton Kidneyeodoro K Grisell Bissette MD, MD 12/19/2018 12:46:00 PM This report has been signed electronically. Number of Addenda: 0 Note Initiated On: 12/19/2018 12:07 PM Scope Withdrawal Time: 0 hours 7 minutes 8 seconds  Total Procedure Duration: 0 hours 13 minutes 35 seconds       [facility]

## 2018-12-19 NOTE — Anesthesia Postprocedure Evaluation (Signed)
Anesthesia Post Note  Patient: Geoffrey Cruz  Procedure(s) Performed: COLONOSCOPY WITH PROPOFOL (N/A )  Patient location during evaluation: Endoscopy Anesthesia Type: General Level of consciousness: awake and alert and oriented Pain management: pain level controlled Vital Signs Assessment: post-procedure vital signs reviewed and stable Respiratory status: spontaneous breathing, nonlabored ventilation and respiratory function stable Cardiovascular status: blood pressure returned to baseline and stable Postop Assessment: no signs of nausea or vomiting Anesthetic complications: no     Last Vitals:  Vitals:   12/19/18 1318 12/19/18 1328  BP:  132/71  Pulse: (!) 35 (!) 49  Resp: (!) 21 14  Temp:    SpO2: 98% 100%    Last Pain:  Vitals:   12/19/18 1328  TempSrc:   PainSc: 0-No pain                 [name]

## 2018-12-19 NOTE — Interval H&P Note (Signed)
History and Physical Interval Note:  12/19/2018 12:24 PM  Geoffrey Cruz  has presented today for surgery, with the diagnosis of Patient history colon polyps, family history colon cancer.  The various methods of treatment have been discussed with the patient and family. After consideration of risks, benefits and other options for treatment, the patient has consented to  Procedure(s): COLONOSCOPY WITH PROPOFOL (N/A) as a surgical intervention .  The patient's history has been reviewed, patient examined, no change in status, stable for surgery.  I have reviewed the patient's chart and labs.  Questions were answered to the patient's satisfaction.     [place], [place]

## 2018-12-19 NOTE — Anesthesia Procedure Notes (Signed)
Date/Time: 12/19/2018 12:27 PM Performed by: Ginger CarneMichelet, [place] Kretz, CRNA Pre-anesthesia Checklist: Patient identified, Emergency Drugs available, Suction available, Patient being monitored and Timeout performed Patient Re-evaluated:Patient Re-evaluated prior to induction Oxygen Delivery Method: Nasal cannula Preoxygenation: Pre-oxygenation with 100% oxygen Induction Type: Combination inhalational/ intravenous induction

## 2018-12-20 ENCOUNTER — Encounter: Payer: Self-pay | Admitting: Internal Medicine

## 2019-02-12 ENCOUNTER — Ambulatory Visit (INDEPENDENT_AMBULATORY_CARE_PROVIDER_SITE_OTHER): Payer: Medicare Other | Admitting: Podiatry

## 2019-02-12 ENCOUNTER — Encounter: Payer: Self-pay | Admitting: Podiatry

## 2019-02-12 DIAGNOSIS — B351 Tinea unguium: Secondary | ICD-10-CM

## 2019-02-12 DIAGNOSIS — G629 Polyneuropathy, unspecified: Secondary | ICD-10-CM | POA: Diagnosis not present

## 2019-02-12 DIAGNOSIS — M79676 Pain in unspecified toe(s): Secondary | ICD-10-CM | POA: Diagnosis not present

## 2019-02-12 NOTE — Progress Notes (Signed)
Complaint:  Visit Type: Patient returns to my office for continued preventative foot care services. 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 The patient presents for preventative foot care services. No changes to ROS.     Podiatric Exam: Vascular: dorsalis pedis and posterior tibial pulses are palpable bilateral. Capillary return is immediate. Temperature gradient is WNL. Skin turgor WNL  Sensorium: Diminished Semmes Weinstein monofilament test. Normal tactile sensation bilaterally. Nail Exam: Pt has thick disfigured discolored nails with subungual debris noted bilateral entire nail hallux through fifth toenails Ulcer Exam: There is no evidence of ulcer or pre-ulcerative changes or infection. Orthopedic Exam: Muscle tone and strength are WNL. No limitations in general ROM. No crepitus or effusions noted. Foot type and digits show no abnormalities. Hallux malleus left.  Hammer toe/mallet toe second  B/L. Skin: No Porokeratosis. No infection or ulcers.  Callus noted heels  B/L.  Diagnosis:  Onychomycosis, , Pain in right toe, pain in left toes  Treatment & Plan Procedures and Treatment: Consent by patient was obtained for treatment procedures. The patient understood the discussion of treatment and procedures well. All questions were answered thoroughly reviewed. Debridement of mycotic and hypertrophic toenails, 1 through 5 bilateral and clearing of subungual debris. No ulceration, no infection noted.   Return Visit-Office Procedure: Patient instructed to return to the office for a follow up visit 3 months for continued evaluation and treatment.    Cordarryl Monrreal DPM 

## 2019-03-27 DIAGNOSIS — G629 Polyneuropathy, unspecified: Secondary | ICD-10-CM | POA: Insufficient documentation

## 2019-05-14 ENCOUNTER — Encounter: Payer: Self-pay | Admitting: Podiatry

## 2019-05-14 ENCOUNTER — Ambulatory Visit (INDEPENDENT_AMBULATORY_CARE_PROVIDER_SITE_OTHER): Payer: Medicare Other | Admitting: Podiatry

## 2019-05-14 ENCOUNTER — Other Ambulatory Visit: Payer: Self-pay

## 2019-05-14 VITALS — Temp 97.1°F

## 2019-05-14 DIAGNOSIS — B351 Tinea unguium: Secondary | ICD-10-CM | POA: Diagnosis not present

## 2019-05-14 DIAGNOSIS — M79676 Pain in unspecified toe(s): Secondary | ICD-10-CM | POA: Diagnosis not present

## 2019-05-14 DIAGNOSIS — G629 Polyneuropathy, unspecified: Secondary | ICD-10-CM | POA: Diagnosis not present

## 2019-05-14 DIAGNOSIS — R234 Changes in skin texture: Secondary | ICD-10-CM | POA: Diagnosis not present

## 2019-05-14 NOTE — Progress Notes (Signed)
Complaint:  Visit Type: Patient returns to my office for continued preventative foot care services. 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 The patient presents for preventative foot care services. No changes to ROS.  Patient has developed a fissure left heel inside heel.   Podiatric Exam: Vascular: dorsalis pedis and posterior tibial pulses are palpable bilateral. Capillary return is immediate. Temperature gradient is WNL. Skin turgor WNL  Sensorium: Diminished Semmes Weinstein monofilament test. Normal tactile sensation bilaterally. Nail Exam: Pt has thick disfigured discolored nails with subungual debris noted bilateral entire nail hallux through fifth toenails Ulcer Exam: There is no evidence of ulcer or pre-ulcerative changes or infection. Orthopedic Exam: Muscle tone and strength are WNL. No limitations in general ROM. No crepitus or effusions noted. Foot type and digits show no abnormalities. Hallux malleus left.  Hammer toe/mallet toe second  B/L. Skin: No Porokeratosis. No infection or ulcers.  Callus noted heels  B/L with fissure left heel.  Diagnosis:  Onychomycosis, , Pain in right toe, pain in left toes  Treatment & Plan Procedures and Treatment: Consent by patient was obtained for treatment procedures. The patient understood the discussion of treatment and procedures well. All questions were answered thoroughly reviewed. Debridement of mycotic and hypertrophic toenails, 1 through 5 bilateral and clearing of subungual debris. No ulceration, no infection noted.  Discussed heel callus and told to use Chapstick in the fissure in an effort to close the fissure left heel. Return Visit-Office Procedure: Patient instructed to return to the office for a follow up visit 3 months for continued evaluation and treatment.    Gardiner Barefoot DPM

## 2019-08-07 ENCOUNTER — Ambulatory Visit (INDEPENDENT_AMBULATORY_CARE_PROVIDER_SITE_OTHER): Payer: Medicare Other | Admitting: Podiatry

## 2019-08-07 ENCOUNTER — Other Ambulatory Visit: Payer: Self-pay

## 2019-08-07 ENCOUNTER — Encounter: Payer: Self-pay | Admitting: Podiatry

## 2019-08-07 DIAGNOSIS — M79676 Pain in unspecified toe(s): Secondary | ICD-10-CM | POA: Diagnosis not present

## 2019-08-07 DIAGNOSIS — G629 Polyneuropathy, unspecified: Secondary | ICD-10-CM

## 2019-08-07 DIAGNOSIS — L989 Disorder of the skin and subcutaneous tissue, unspecified: Secondary | ICD-10-CM | POA: Diagnosis not present

## 2019-08-07 DIAGNOSIS — E0843 Diabetes mellitus due to underlying condition with diabetic autonomic (poly)neuropathy: Secondary | ICD-10-CM

## 2019-08-07 DIAGNOSIS — B351 Tinea unguium: Secondary | ICD-10-CM | POA: Diagnosis not present

## 2019-08-07 MED ORDER — GABAPENTIN 300 MG PO CAPS: 300.0000 mg | ORAL_CAPSULE | Freq: Three times a day (TID) | ORAL | 3 refills | Status: DC

## 2019-08-09 NOTE — Progress Notes (Signed)
    Subjective: Patient is a 63 y.o. male presenting to the office today for follow up evaluation of painful callus lesion(s) noted to the bilateral feet. Walking and bearing weight increases the pain. He has not had any recent treatment for his symptoms.  Patient also complains of elongated, thickened nails that cause pain while ambulating in shoes. He is unable to trim his own nails. Patient presents today for further treatment and evaluation.  Past Medical History:  Diagnosis Date  . Anxiety   . Bipolar disorder ([place])   . Depression   . History of prostatitis   . Hyperlipidemia   . Hypogonadism in male   . Hypothyroidism   . Psoriasis   . Schizophrenia ([place])   . Sleep apnea   . Swelling   . Thyroid disease     Objective:  Physical Exam General: Alert and oriented x3 in no acute distress  Dermatology: Hyperkeratotic lesion(s) present on the bilateral feet. Pain on palpation with a central nucleated core noted. Skin is warm, dry and supple bilateral lower extremities. Negative for open lesions or macerations. Nails are tender, long, thickened and dystrophic with subungual debris, consistent with onychomycosis, 1-5 bilateral. No signs of infection noted.  Vascular: Palpable pedal pulses bilaterally. No edema or erythema noted. Capillary refill within normal limits.  Neurological: Epicritic and protective threshold diminished bilaterally.   Musculoskeletal Exam: Pain on palpation at the keratotic lesion(s) noted. Range of motion within normal limits bilateral. Muscle strength 5/5 in all groups bilateral.  Assessment: 1. Onychodystrophic nails 1-5 bilateral with hyperkeratosis of nails.  2. Onychomycosis of nail due to dermatophyte bilateral 3. Pre-ulcerative callus lesions noted to the bilateral feet x 5   Plan of Care:  1. Patient evaluated. 2. Excisional debridement of keratoic lesion(s) using a chisel blade was performed without incident.  3. Dressed with light dressing.  4. Mechanical debridement of nails 1-5 bilaterally performed using a nail nipper. Filed with dremel without incident.  5. Increase Gabapentin from 100 mg to 300 mg TID.  6. Patient is to return to the clinic in 3 months for routine care.   Edrick Kins, DPM [facility]  Dr. Edrick Kins, [address]                                        [place], [place] [address]                Office [phone]  Fax [phone]

## 2019-08-16 ENCOUNTER — Ambulatory Visit: Payer: Medicare Other | Admitting: Podiatry

## 2019-11-05 ENCOUNTER — Ambulatory Visit (INDEPENDENT_AMBULATORY_CARE_PROVIDER_SITE_OTHER): Payer: Medicare Other | Admitting: Podiatry

## 2019-11-05 ENCOUNTER — Encounter: Payer: Self-pay | Admitting: Podiatry

## 2019-11-05 ENCOUNTER — Other Ambulatory Visit: Payer: Self-pay

## 2019-11-05 DIAGNOSIS — G629 Polyneuropathy, unspecified: Secondary | ICD-10-CM

## 2019-11-05 DIAGNOSIS — B351 Tinea unguium: Secondary | ICD-10-CM

## 2019-11-05 DIAGNOSIS — M79676 Pain in unspecified toe(s): Secondary | ICD-10-CM

## 2019-11-05 NOTE — Progress Notes (Signed)
Complaint:  Visit Type: Patient returns to my office for continued preventative foot care services. 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 The patient presents for preventative foot care services. No changes to ROS.     Podiatric Exam: Vascular: dorsalis pedis and posterior tibial pulses are palpable bilateral. Capillary return is immediate. Temperature gradient is WNL. Skin turgor WNL  Sensorium: Diminished Semmes Weinstein monofilament test. Normal tactile sensation bilaterally. Nail Exam: Pt has thick disfigured discolored nails with subungual debris noted bilateral entire nail hallux through fifth toenails Ulcer Exam: There is no evidence of ulcer or pre-ulcerative changes or infection. Orthopedic Exam: Muscle tone and strength are WNL. No limitations in general ROM. No crepitus or effusions noted. Foot type and digits show no abnormalities. Hallux malleus left.  Hammer toe/mallet toe second  B/L. Skin: No Porokeratosis. No infection or ulcers.  Callus noted heels  B/L.  Diagnosis:  Onychomycosis, , Pain in right toe, pain in left toes  Treatment & Plan Procedures and Treatment: Consent by patient was obtained for treatment procedures. The patient understood the discussion of treatment and procedures well. All questions were answered thoroughly reviewed. Debridement of mycotic and hypertrophic toenails, 1 through 5 bilateral and clearing of subungual debris. No ulceration, no infection noted.   Return Visit-Office Procedure: Patient instructed to return to the office for a follow up visit 3 months for continued evaluation and treatment.    Gardiner Barefoot DPM

## 2019-12-02 ENCOUNTER — Other Ambulatory Visit: Payer: Self-pay | Admitting: Podiatry

## 2020-02-04 ENCOUNTER — Ambulatory Visit: Payer: Medicare Other | Admitting: Podiatry

## 2020-02-04 ENCOUNTER — Other Ambulatory Visit: Payer: Self-pay

## 2020-02-04 ENCOUNTER — Ambulatory Visit (INDEPENDENT_AMBULATORY_CARE_PROVIDER_SITE_OTHER): Payer: Medicare Other | Admitting: Podiatry

## 2020-02-04 ENCOUNTER — Encounter: Payer: Self-pay | Admitting: Podiatry

## 2020-02-04 DIAGNOSIS — B351 Tinea unguium: Secondary | ICD-10-CM | POA: Diagnosis not present

## 2020-02-04 DIAGNOSIS — G629 Polyneuropathy, unspecified: Secondary | ICD-10-CM | POA: Diagnosis not present

## 2020-02-04 DIAGNOSIS — M79676 Pain in unspecified toe(s): Secondary | ICD-10-CM

## 2020-02-04 DIAGNOSIS — E0843 Diabetes mellitus due to underlying condition with diabetic autonomic (poly)neuropathy: Secondary | ICD-10-CM | POA: Diagnosis not present

## 2020-02-04 NOTE — Progress Notes (Signed)
Complaint:  Visit Type: Patient returns to my office for continued preventative foot care services. 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 The patient presents for preventative foot care services. No changes to ROS.     Podiatric Exam: Vascular: dorsalis pedis and posterior tibial pulses are palpable bilateral. Capillary return is immediate. Temperature gradient is WNL. Skin turgor WNL  Sensorium: Diminished Semmes Weinstein monofilament test. Normal tactile sensation bilaterally. Nail Exam: Pt has thick disfigured discolored nails with subungual debris noted bilateral entire nail hallux through fifth toenails Ulcer Exam: There is no evidence of ulcer or pre-ulcerative changes or infection. Orthopedic Exam: Muscle tone and strength are WNL. No limitations in general ROM. No crepitus or effusions noted. Foot type and digits show no abnormalities. Hallux malleus left.  Hammer toe/mallet toe second  B/L. Skin: No Porokeratosis. No infection or ulcers.  Callus noted heels  Left foot.  Asymptomatic callus sub 1  B/L.  Diagnosis:  Onychomycosis, , Pain in right toe, pain in left toes  Treatment & Plan Procedures and Treatment: Consent by patient was obtained for treatment procedures. The patient understood the discussion of treatment and procedures well. All questions were answered thoroughly reviewed. Debridement of mycotic and hypertrophic toenails, 1 through 5 bilateral and clearing of subungual debris. No ulceration, no infection noted.   Return Visit-Office Procedure: Patient instructed to return to the office for a follow up visit 3 months for continued evaluation and treatment.    Helane Gunther DPM

## 2020-04-03 ENCOUNTER — Other Ambulatory Visit: Payer: Self-pay | Admitting: Podiatry

## 2020-05-08 ENCOUNTER — Encounter: Payer: Self-pay | Admitting: Podiatry

## 2020-05-08 ENCOUNTER — Ambulatory Visit (INDEPENDENT_AMBULATORY_CARE_PROVIDER_SITE_OTHER): Payer: Medicare Other | Admitting: Podiatry

## 2020-05-08 ENCOUNTER — Other Ambulatory Visit: Payer: Self-pay

## 2020-05-08 DIAGNOSIS — B351 Tinea unguium: Secondary | ICD-10-CM | POA: Diagnosis not present

## 2020-05-08 DIAGNOSIS — G629 Polyneuropathy, unspecified: Secondary | ICD-10-CM | POA: Diagnosis not present

## 2020-05-08 DIAGNOSIS — E0843 Diabetes mellitus due to underlying condition with diabetic autonomic (poly)neuropathy: Secondary | ICD-10-CM

## 2020-05-08 DIAGNOSIS — M79676 Pain in unspecified toe(s): Secondary | ICD-10-CM

## 2020-05-08 NOTE — Progress Notes (Signed)
This patient returns to my office for at risk foot care.  This patient requires this care by a professional since this patient will be at risk due to having  Neuropathy.  This patient is unable to cut nails himself since the patient cannot reach his nails.These nails are painful walking and wearing shoes.  This patient presents for at risk foot care today.  General Appearance  Alert, conversant and in no acute stress.  Vascular  Dorsalis pedis and posterior tibial  pulses are palpable  bilaterally.  Capillary return is within normal limits  bilaterally. Temperature is within normal limits  bilaterally.  Neurologic  Senn-Weinstein monofilament wire test diminished   bilaterally. Muscle power within normal limits bilaterally.  Nails Thick disfigured discolored nails with subungual debris  from hallux to fifth toes bilaterally. No evidence of bacterial infection or drainage bilaterally.  Orthopedic  No limitations of motion  feet .  No crepitus or effusions noted.  No bony pathology or digital deformities noted. Hallux malleus  B/L.   Skin  normotropic skin with no porokeratosis noted bilaterally.  No signs of infections or ulcers noted.  Asymptomatic heel callus.  Asymptomatic callus sub 1  B/L.   Onychomycosis  Pain in right toes  Pain in left toes  Consent was obtained for treatment procedures.   Mechanical debridement of nails 1-5  bilaterally performed with a nail nipper.  Filed with dremel without incident.    Return office visit    3 months                 Told patient to return for periodic foot care and evaluation due to potential at risk complications.   Corrinne Benegas DPM  

## 2020-06-03 ENCOUNTER — Encounter: Payer: Medicare Other | Admitting: Podiatry

## 2020-07-29 ENCOUNTER — Other Ambulatory Visit: Payer: Self-pay | Admitting: Podiatry

## 2020-08-07 DIAGNOSIS — M79676 Pain in unspecified toe(s): Secondary | ICD-10-CM

## 2020-08-14 ENCOUNTER — Ambulatory Visit (INDEPENDENT_AMBULATORY_CARE_PROVIDER_SITE_OTHER): Payer: Medicare Other | Admitting: Podiatry

## 2020-08-14 ENCOUNTER — Encounter: Payer: Self-pay | Admitting: Podiatry

## 2020-08-14 ENCOUNTER — Other Ambulatory Visit: Payer: Self-pay

## 2020-08-14 DIAGNOSIS — E0843 Diabetes mellitus due to underlying condition with diabetic autonomic (poly)neuropathy: Secondary | ICD-10-CM

## 2020-08-14 DIAGNOSIS — G629 Polyneuropathy, unspecified: Secondary | ICD-10-CM

## 2020-08-14 DIAGNOSIS — M79676 Pain in unspecified toe(s): Secondary | ICD-10-CM | POA: Diagnosis not present

## 2020-08-14 DIAGNOSIS — B351 Tinea unguium: Secondary | ICD-10-CM

## 2020-08-14 NOTE — Progress Notes (Signed)
This patient returns to my office for at risk foot care.  This patient requires this care by a professional since this patient will be at risk due to having  Neuropathy.  This patient is unable to cut nails himself since the patient cannot reach his nails.These nails are painful walking and wearing shoes.  This patient presents for at risk foot care today.  General Appearance  Alert, conversant and in no acute stress.  Vascular  Dorsalis pedis and posterior tibial  pulses are palpable  bilaterally.  Capillary return is within normal limits  bilaterally. Temperature is within normal limits  bilaterally.  Neurologic  Senn-Weinstein monofilament wire test diminished   bilaterally. Muscle power within normal limits bilaterally.  Nails Thick disfigured discolored nails with subungual debris  from hallux to fifth toes bilaterally. No evidence of bacterial infection or drainage bilaterally.  Orthopedic  No limitations of motion  feet .  No crepitus or effusions noted.  No bony pathology or digital deformities noted. Hallux malleus  B/L.   Skin  normotropic skin with no porokeratosis noted bilaterally.  No signs of infections or ulcers noted.  Asymptomatic heel callus.  Asymptomatic callus sub 1  B/L.   Onychomycosis  Pain in right toes  Pain in left toes  Consent was obtained for treatment procedures.   Mechanical debridement of nails 1-5  bilaterally performed with a nail nipper.  Filed with dremel without incident.    Return office visit    3 months                 Told patient to return for periodic foot care and evaluation due to potential at risk complications.   Helane Gunther DPM

## 2020-10-01 ENCOUNTER — Ambulatory Visit (INDEPENDENT_AMBULATORY_CARE_PROVIDER_SITE_OTHER): Payer: Medicare Other | Admitting: Podiatry

## 2020-10-01 ENCOUNTER — Ambulatory Visit (INDEPENDENT_AMBULATORY_CARE_PROVIDER_SITE_OTHER): Payer: Medicare Other

## 2020-10-01 ENCOUNTER — Encounter: Payer: Self-pay | Admitting: Podiatry

## 2020-10-01 ENCOUNTER — Other Ambulatory Visit: Payer: Self-pay

## 2020-10-01 ENCOUNTER — Other Ambulatory Visit: Payer: Self-pay | Admitting: Podiatry

## 2020-10-01 DIAGNOSIS — M778 Other enthesopathies, not elsewhere classified: Secondary | ICD-10-CM

## 2020-10-01 DIAGNOSIS — M19079 Primary osteoarthritis, unspecified ankle and foot: Secondary | ICD-10-CM | POA: Diagnosis not present

## 2020-10-01 DIAGNOSIS — M19072 Primary osteoarthritis, left ankle and foot: Secondary | ICD-10-CM | POA: Diagnosis not present

## 2020-10-01 NOTE — Progress Notes (Signed)
  Subjective:  Patient ID: JAVEN HINDERLITER, male    DOB: Apr 23, 1956,  MRN: [id number]  Chief Complaint  Patient presents with  . Foot Pain    Patient presents today for left top of foot pain that radiates down lateral side foot and is tender to squeeze foot x 1 month    64 y.o. male presents with the above complaint. History confirmed with patient.   Objective:  Physical Exam: warm, good capillary refill, no trophic changes or ulcerative lesions and normal DP and PT pulses. 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 Arthritis, midfoot      Plan:  Patient was evaluated and treated and all questions answered.   -XR reviewed with patient  -Discussed change in shoe gear including stiffer soled shoes -Recommended Voltaren gel use for pain control -Discussed with the should fail the primary treatment would be arthrodesis of these joints  Return if symptoms worsen or fail to improve.

## 2020-10-01 NOTE — Patient Instructions (Addendum)
Look for Voltaren gel at the pharmacy over the counter or online (also known as diclofenac 1% gel). Apply to the painful areas 3-4x daily with the supplied dosing card. Allow to dry for 10 minutes before going into socks/shoes  Arthritis Arthritis means joint pain. It can also mean joint disease. A joint is a place where bones come together. There are more than 100 types of arthritis. What are the causes? This condition may be caused by:  Wear and tear of a joint. This is the most common cause.  A lot of acid in the blood, which leads to pain in the joint (gout).  Pain and swelling (inflammation) in a joint.  Infection of a joint.  Injuries in the joint.  A reaction to medicines (allergy). In some cases, the cause may not be known. What are the signs or symptoms? Symptoms of this condition include:  Redness at a joint.  Swelling at a joint.  Stiffness at a joint.  Warmth coming from the joint.  A fever.  A feeling of being sick. How is this treated? This condition may be treated with:  Treating the cause, if it is known.  Rest.  Raising (elevating) the joint.  Putting cold or hot packs on the joint.  Medicines to treat symptoms and reduce pain and swelling.  Shots of medicines (cortisone) into the joint. You may also be told to make changes in your life, such as doing exercises and losing weight. Follow these instructions at home: Medicines  Take over-the-counter and prescription medicines only as told by your doctor.  Do not take aspirin for pain if your doctor says that you may have gout. Activity  Rest your joint if your doctor tells you to.  Avoid activities that make the pain worse.  Exercise your joint regularly as told by your doctor. Try doing exercises like: ? Swimming. ? Water aerobics. ? Biking. ? Walking. Managing pain, stiffness, and swelling      If told, put ice on the affected area. ? Put ice in a plastic bag. ? Place a towel  between your skin and the bag. ? Leave the ice on for 20 minutes, 2-3 times per day.  If your joint is swollen, raise (elevate) it above the level of your heart if told by your doctor.  If your joint feels stiff in the morning, try taking a warm shower.  If told, put heat on the affected area. Do this as often as told by your doctor. Use the heat source that your doctor recommends, such as a moist heat pack or a heating pad. If you have diabetes, do not apply heat without asking your doctor. To apply heat: ? Place a towel between your skin and the heat source. ? Leave the heat on for 20-30 minutes. ? Remove the heat if your skin turns bright red. This is very important if you are unable to feel pain, heat, or cold. You may have a greater risk of getting burned. General instructions  Do not use any products that contain nicotine or tobacco, such as cigarettes, e-cigarettes, and chewing tobacco. If you need help quitting, ask your doctor.  Keep all follow-up visits as told by your doctor. This is important. Contact a doctor if:  The pain gets worse.  You have a fever. Get help right away if:  You have very bad pain in your joint.  You have swelling in your joint.  Your joint is red.  Many joints become painful and  swollen.  You have very bad back pain.  Your leg is very weak.  You cannot control your pee (urine) or poop (stool). Summary  Arthritis means joint pain. It can also mean joint disease. A joint is a place where bones come together.  The most common cause of this condition is wear and tear of a joint.  Symptoms of this condition include redness, swelling, or stiffness of the joint.  This condition is treated with rest, raising the joint, medicines, and putting cold or hot packs on the joint.  Follow your doctor's instructions about medicines, activity, exercises, and other home care treatments. This information is not intended to replace advice given to you by  your health care provider. Make sure you discuss any questions you have with your health care provider. Document Revised: 10/30/2018 Document Reviewed: 10/30/2018 Elsevier Patient Education  2020 [employer].

## 2020-10-30 ENCOUNTER — Other Ambulatory Visit: Payer: Self-pay | Admitting: Podiatry

## 2020-11-03 NOTE — Telephone Encounter (Signed)
Please advise 

## 2020-11-13 ENCOUNTER — Encounter: Payer: Self-pay | Admitting: Podiatry

## 2020-11-13 ENCOUNTER — Other Ambulatory Visit: Payer: Self-pay

## 2020-11-13 ENCOUNTER — Ambulatory Visit (INDEPENDENT_AMBULATORY_CARE_PROVIDER_SITE_OTHER): Payer: Medicare Other | Admitting: Podiatry

## 2020-11-13 DIAGNOSIS — G629 Polyneuropathy, unspecified: Secondary | ICD-10-CM | POA: Diagnosis not present

## 2020-11-13 DIAGNOSIS — M79676 Pain in unspecified toe(s): Secondary | ICD-10-CM | POA: Diagnosis not present

## 2020-11-13 DIAGNOSIS — B351 Tinea unguium: Secondary | ICD-10-CM | POA: Diagnosis not present

## 2020-11-13 DIAGNOSIS — E0843 Diabetes mellitus due to underlying condition with diabetic autonomic (poly)neuropathy: Secondary | ICD-10-CM | POA: Diagnosis not present

## 2020-11-13 NOTE — Progress Notes (Signed)
This patient returns to my office for at risk foot care.  This patient requires this care by a professional since this patient will be at risk due to having  Neuropathy.  This patient is unable to cut nails himself since the patient cannot reach his nails.These nails are painful walking and wearing shoes.  This patient presents for at risk foot care today.  General Appearance  Alert, conversant and in no acute stress.  Vascular  Dorsalis pedis and posterior tibial  pulses are palpable  bilaterally.  Capillary return is within normal limits  bilaterally. Temperature is within normal limits  bilaterally.  Neurologic  Senn-Weinstein monofilament wire test diminished   bilaterally. Muscle power within normal limits bilaterally.  Nails Thick disfigured discolored nails with subungual debris  from hallux to fifth toes bilaterally. No evidence of bacterial infection or drainage bilaterally.  Orthopedic  No limitations of motion  feet .  No crepitus or effusions noted.  No bony pathology or digital deformities noted. Hallux malleus  B/L.   Skin  normotropic skin with no porokeratosis noted bilaterally.  No signs of infections or ulcers noted.  Asymptomatic heel callus.  Asymptomatic callus sub 1  B/L.   Onychomycosis  Pain in right toes  Pain in left toes  Consent was obtained for treatment procedures.   Mechanical debridement of nails 1-5  bilaterally performed with a nail nipper.  Filed with dremel without incident.    Return office visit    3 months                 Told patient to return for periodic foot care and evaluation due to potential at risk complications.   Helane Gunther DPM

## 2021-02-12 ENCOUNTER — Other Ambulatory Visit: Payer: Self-pay

## 2021-02-12 ENCOUNTER — Encounter: Payer: Self-pay | Admitting: Podiatry

## 2021-02-12 ENCOUNTER — Ambulatory Visit (INDEPENDENT_AMBULATORY_CARE_PROVIDER_SITE_OTHER): Payer: Medicare Other | Admitting: Podiatry

## 2021-02-12 DIAGNOSIS — G629 Polyneuropathy, unspecified: Secondary | ICD-10-CM | POA: Diagnosis not present

## 2021-02-12 DIAGNOSIS — M79676 Pain in unspecified toe(s): Secondary | ICD-10-CM | POA: Diagnosis not present

## 2021-02-12 DIAGNOSIS — E0843 Diabetes mellitus due to underlying condition with diabetic autonomic (poly)neuropathy: Secondary | ICD-10-CM | POA: Diagnosis not present

## 2021-02-12 DIAGNOSIS — B351 Tinea unguium: Secondary | ICD-10-CM

## 2021-02-12 NOTE — Progress Notes (Signed)
This patient returns to my office for at risk foot care.  This patient requires this care by a professional since this patient will be at risk due to having  Neuropathy.  This patient is unable to cut nails himself since the patient cannot reach his nails.These nails are painful walking and wearing shoes.  This patient presents for at risk foot care today.  General Appearance  Alert, conversant and in no acute stress.  Vascular  Dorsalis pedis and posterior tibial  pulses are palpable  bilaterally.  Capillary return is within normal limits  bilaterally. Temperature is within normal limits  Bilaterally. Right foot is cooler than left foot.  Neurologic  Senn-Weinstein monofilament wire test diminished   bilaterally. Muscle power within normal limits bilaterally.  Nails Thick disfigured discolored nails with subungual debris  from hallux to fifth toes bilaterally. No evidence of bacterial infection or drainage bilaterally.  Orthopedic  No limitations of motion  feet .  No crepitus or effusions noted.  No bony pathology or digital deformities noted. Hallux malleus  B/L.   Skin  normotropic skin with no porokeratosis noted bilaterally.  No signs of infections or ulcers noted.  Asymptomatic heel callus.  Asymptomatic callus sub 1  B/L.   Onychomycosis  Pain in right toes  Pain in left toes  Consent was obtained for treatment procedures.   Mechanical debridement of nails 1-5  bilaterally performed with a nail nipper.  Filed with dremel without incident.    Return office visit    3 months                 Told patient to return for periodic foot care and evaluation due to potential at risk complications. Consider vascular studies if right foot worsens.   Helane Gunther DPM

## 2021-02-18 ENCOUNTER — Ambulatory Visit (INDEPENDENT_AMBULATORY_CARE_PROVIDER_SITE_OTHER): Payer: Medicare Other | Admitting: Podiatry

## 2021-02-18 ENCOUNTER — Other Ambulatory Visit: Payer: Self-pay

## 2021-02-18 DIAGNOSIS — G629 Polyneuropathy, unspecified: Secondary | ICD-10-CM | POA: Diagnosis not present

## 2021-02-18 DIAGNOSIS — M19079 Primary osteoarthritis, unspecified ankle and foot: Secondary | ICD-10-CM

## 2021-02-18 DIAGNOSIS — M19071 Primary osteoarthritis, right ankle and foot: Secondary | ICD-10-CM

## 2021-02-18 NOTE — Progress Notes (Signed)
Patient presents to be casted for orthotics.  A foam impression was casted for his right and left foot.  Patient is a size 11 medium (narrow)  Patient will be contacted when the orthotics are ready for pick up.

## 2021-02-25 ENCOUNTER — Other Ambulatory Visit: Payer: Self-pay | Admitting: Podiatry

## 2021-03-27 ENCOUNTER — Ambulatory Visit (INDEPENDENT_AMBULATORY_CARE_PROVIDER_SITE_OTHER): Payer: Medicare Other | Admitting: Podiatry

## 2021-03-27 ENCOUNTER — Encounter: Payer: Self-pay | Admitting: Podiatry

## 2021-03-27 ENCOUNTER — Other Ambulatory Visit: Payer: Self-pay

## 2021-03-27 DIAGNOSIS — G629 Polyneuropathy, unspecified: Secondary | ICD-10-CM

## 2021-03-27 DIAGNOSIS — M5416 Radiculopathy, lumbar region: Secondary | ICD-10-CM

## 2021-04-12 NOTE — Progress Notes (Signed)
    Subjective: Patient is a 65 y.o. male presenting to the office today for follow up evaluation of neuropathy to the bilateral lower extremities.  He was last seen in the office on 08/07/2019.  He states that over the past few years the pain has increasingly become worse.  He states that the gabapentin is not helping.  He presents for further treatment and evaluation  Past Medical History:  Diagnosis Date  . Anxiety   . Bipolar disorder (HCC)   . Depression   . History of prostatitis   . Hyperlipidemia   . Hypogonadism in male   . Hypothyroidism   . Psoriasis   . Schizophrenia (HCC)   . Sleep apnea   . Swelling   . Thyroid disease     Objective:  Physical Exam General: Alert and oriented x3 in no acute distress  Dermatology: No open lesions noted.  Skin is warm dry and supple bilateral lower extremities.  There is some hyperkeratosis of the nails bilateral  Vascular: Palpable pedal pulses bilaterally. No edema or erythema noted. Capillary refill within normal limits.  Neurological: Epicritic and protective threshold diminished bilaterally.   Musculoskeletal Exam: Range of motion within normal limits bilateral. Muscle strength 5/5 in all groups bilateral.  Assessment: 1.  Peripheral polyneuropathy bilateral lower extremities 2.  Likely lumbar radiculopathy   Plan of Care:  1. Patient evaluated. 2.  Patient states that his symptoms increase and his legs feel really numb when in a sitting position.  He does state that he has a history of back pain.  The paresthesia with numbness extends all the way up into his legs and thighs and buttocks area.  Suspect more proximal etiology suspicious for sciatica or lumbar radiculopathy 3.  Continue gabapentin 300 mg 3 times daily as per PCP 4.  Recommend referral from PCP to neurology or neuro spine 5.  Return to clinic as needed  Felecia Shelling, DPM [facility]  Dr. Felecia Shelling, DPM    [address] [place], [place] [address]                Office [phone]  Fax [phone]

## 2021-05-18 ENCOUNTER — Ambulatory Visit: Payer: Medicare Other | Admitting: Podiatry

## 2021-05-28 ENCOUNTER — Other Ambulatory Visit: Payer: Self-pay

## 2021-05-28 ENCOUNTER — Ambulatory Visit (INDEPENDENT_AMBULATORY_CARE_PROVIDER_SITE_OTHER): Payer: Medicare Other | Admitting: Podiatry

## 2021-05-28 ENCOUNTER — Encounter: Payer: Self-pay | Admitting: Podiatry

## 2021-05-28 DIAGNOSIS — M79676 Pain in unspecified toe(s): Secondary | ICD-10-CM | POA: Diagnosis not present

## 2021-05-28 DIAGNOSIS — B351 Tinea unguium: Secondary | ICD-10-CM | POA: Diagnosis not present

## 2021-05-28 DIAGNOSIS — E0843 Diabetes mellitus due to underlying condition with diabetic autonomic (poly)neuropathy: Secondary | ICD-10-CM

## 2021-05-28 NOTE — Progress Notes (Signed)
This patient returns to my office for at risk foot care.  This patient requires this care by a professional since this patient will be at risk due to having  Neuropathy.  This patient is unable to cut nails himself since the patient cannot reach his nails.These nails are painful walking and wearing shoes.  This patient presents for at risk foot care today.  General Appearance  Alert, conversant and in no acute stress.  Vascular  Dorsalis pedis and posterior tibial  pulses are palpable  bilaterally.  Capillary return is within normal limits  bilaterally. Temperature is within normal limits  Bilaterally. Right foot is cooler than left foot.  Neurologic  Senn-Weinstein monofilament wire test diminished   bilaterally. Muscle power within normal limits bilaterally.  Nails Thick disfigured discolored nails with subungual debris  from hallux to fifth toes bilaterally. No evidence of bacterial infection or drainage bilaterally.  Orthopedic  No limitations of motion  feet .  No crepitus or effusions noted.  No bony pathology or digital deformities noted. Hallux malleus  B/L.   Skin  normotropic skin with no porokeratosis noted bilaterally.  No signs of infections or ulcers noted.  Asymptomatic heel callus.  Asymptomatic callus sub 1  B/L. Fissured left heel.  Onychomycosis  Pain in right toes  Pain in left toes  Consent was obtained for treatment procedures.   Mechanical debridement of nails 1-5  bilaterally performed with a nail nipper.  Filed with dremel without incident.    Return office visit    3 months                 Told patient to return for periodic foot care and evaluation due to potential at risk complications.   Helane Gunther DPM

## 2021-09-03 ENCOUNTER — Encounter: Payer: Self-pay | Admitting: Podiatry

## 2021-09-03 ENCOUNTER — Ambulatory Visit (INDEPENDENT_AMBULATORY_CARE_PROVIDER_SITE_OTHER): Payer: Medicare Other | Admitting: Podiatry

## 2021-09-03 ENCOUNTER — Other Ambulatory Visit: Payer: Self-pay

## 2021-09-03 DIAGNOSIS — L84 Corns and callosities: Secondary | ICD-10-CM | POA: Diagnosis not present

## 2021-09-03 DIAGNOSIS — E0843 Diabetes mellitus due to underlying condition with diabetic autonomic (poly)neuropathy: Secondary | ICD-10-CM | POA: Diagnosis not present

## 2021-09-03 DIAGNOSIS — M79676 Pain in unspecified toe(s): Secondary | ICD-10-CM | POA: Diagnosis not present

## 2021-09-03 DIAGNOSIS — B351 Tinea unguium: Secondary | ICD-10-CM | POA: Diagnosis not present

## 2021-09-03 NOTE — Progress Notes (Addendum)
This patient returns to my office for at risk foot care.  This patient requires this care by a professional since this patient will be at risk due to having  Neuropathy.  This patient is unable to cut nails himself since the patient cannot reach his nails.These nails are painful walking and wearing shoes.  This patient presents for at risk foot care today.  General Appearance  Alert, conversant and in no acute stress.  Vascular  Dorsalis pedis and posterior tibial  pulses are palpable  bilaterally.  Capillary return is within normal limits  bilaterally. Temperature is within normal limits  Bilaterally. Right foot is cooler than left foot.  Neurologic  Senn-Weinstein monofilament wire test diminished   bilaterally. Muscle power within normal limits bilaterally.  Nails Thick disfigured discolored nails with subungual debris  from hallux to fifth toes bilaterally. No evidence of bacterial infection or drainage bilaterally.  Orthopedic  No limitations of motion  feet .  No crepitus or effusions noted.  No bony pathology or digital deformities noted. Hallux malleus  B/L.   Skin  normotropic skin with no porokeratosis noted bilaterally.  No signs of infections or ulcers noted.  Asymptomatic heel callus.  Asymptomatic callus sub 1  B/L. Fissured left heel.  Onychomycosis  Pain in right toes  Pain in left toes  Consent was obtained for treatment procedures.   Mechanical debridement of nails 1-5  bilaterally performed with a nail nipper.  Filed with dremel without incident. Used dremel tool on heel callus.   Return office visit    3 months                 Told patient to return for periodic foot care and evaluation due to potential at risk complications.   Helane Gunther DPM

## 2021-10-20 ENCOUNTER — Encounter (INDEPENDENT_AMBULATORY_CARE_PROVIDER_SITE_OTHER): Payer: Self-pay

## 2021-10-20 ENCOUNTER — Other Ambulatory Visit: Payer: Self-pay

## 2021-10-20 ENCOUNTER — Ambulatory Visit (INDEPENDENT_AMBULATORY_CARE_PROVIDER_SITE_OTHER): Payer: Medicare Other | Admitting: Podiatry

## 2021-10-20 DIAGNOSIS — M722 Plantar fascial fibromatosis: Secondary | ICD-10-CM

## 2021-10-20 NOTE — Progress Notes (Signed)
    Subjective: Patient is a 65 y.o. male presenting to the office today for new complaint regarding plantar fasciitis to the left foot.  Patient states that he was experiencing arch pain to the left foot for several weeks and about 2 weeks ago he heard an audible pop to the plantar arch of the foot.  He had an increase of pain and tenderness with swelling however over the past few weeks the pain has completely resolved.  Patient no longer has pain along the plantar arch.  He presents for follow-up treatment evaluation just to ensure everything is still intact and okay  Past Medical History:  Diagnosis Date   Anxiety    Bipolar disorder (HCC)    Depression    History of prostatitis    Hyperlipidemia    Hypogonadism in male    Hypothyroidism    Psoriasis    Schizophrenia (HCC)    Sleep apnea    Swelling    Thyroid disease     Objective:  Physical Exam General: Alert and oriented x3 in no acute distress  Dermatology: No open lesions noted.  Skin is warm dry and supple bilateral lower extremities.  There is some hyperkeratosis of the nails bilateral  Vascular: Palpable pedal pulses bilaterally. No edema or erythema noted. Capillary refill within normal limits.  Neurological: Epicritic and protective threshold diminished bilaterally.   Musculoskeletal Exam: Range of motion within normal limits bilateral. Muscle strength 5/5 in all groups bilateral.  No pedal deformity  Assessment: 1.  Peripheral polyneuropathy bilateral lower extremities 2.  Plantar fasciitis left foot; resolved   Plan of Care:  1. Patient evaluated.  Patient likely had a partial rupture/tear of his plantar fascia which alleviated the tension in the area.  Patient is doing well so we will simply observe for now 2. Continue gabapentin 300 mg 3 times daily as per PCP 3.  Fortunately the patient's symptoms have completely resolved.  Recommend good supportive shoes and sneakers 4.  Return to clinic as  needed  Felecia Shelling, DPM [facility]  Dr. Felecia Shelling, DPM    [address] [place], [place] [address]                Office [phone]  Fax [phone]

## 2021-11-12 ENCOUNTER — Telehealth: Payer: Self-pay | Admitting: *Deleted

## 2021-11-12 NOTE — Telephone Encounter (Signed)
"  I'm a patient of Dr. Logan Bores.  I was just wondering if you could call me back.  I'm having ankle problems on my left ankle.  It's usually Plantar Fasciitis.  Give me a call."

## 2021-11-13 NOTE — Telephone Encounter (Signed)
Patient needs appt to f/u. - Dr. Logan Bores

## 2021-11-16 ENCOUNTER — Telehealth: Payer: Self-pay

## 2021-11-16 NOTE — Telephone Encounter (Signed)
Patient is calling about his plantar gasciitis - per Dr. Logan Bores, he needs an appointment. Please call to schedule. Thanks

## 2021-11-16 NOTE — Telephone Encounter (Signed)
Called pt LM on VM for pt to call the office so we can schedule him an appt per Dr Logan Bores.

## 2021-11-17 ENCOUNTER — Other Ambulatory Visit: Payer: Self-pay

## 2021-11-17 ENCOUNTER — Ambulatory Visit (INDEPENDENT_AMBULATORY_CARE_PROVIDER_SITE_OTHER): Payer: Medicare Other | Admitting: Podiatry

## 2021-11-17 DIAGNOSIS — M722 Plantar fascial fibromatosis: Secondary | ICD-10-CM

## 2021-11-17 DIAGNOSIS — G629 Polyneuropathy, unspecified: Secondary | ICD-10-CM | POA: Diagnosis not present

## 2021-11-17 NOTE — Progress Notes (Signed)
° ° °  Subjective: Patient is a 65 y.o. male presenting to the office today f for follow-up evaluation of left foot pain.  Patient states that he has no pain associated to his foot and is doing very well however has some residual swelling to the foot and leg.  He presents for further treatment and evaluation  Past Medical History:  Diagnosis Date   Anxiety    Bipolar disorder (HCC)    Depression    History of prostatitis    Hyperlipidemia    Hypogonadism in male    Hypothyroidism    Psoriasis    Schizophrenia (HCC)    Sleep apnea    Swelling    Thyroid disease     Objective:  Physical Exam General: Alert and oriented x3 in no acute distress  Dermatology: No open lesions noted.  Skin is warm dry and supple bilateral lower extremities.  There is some hyperkeratosis of the nails bilateral  Vascular: Palpable pedal pulses bilaterally.  Pitting edema noted to the left lower extremity.  Capillary refill within normal limits.  Neurological: Epicritic and protective threshold diminished bilaterally.   Musculoskeletal Exam: Range of motion within normal limits bilateral. Muscle strength 5/5 in all groups bilateral.  No pedal deformity.  Negative for any significant pain on palpation throughout the foot  Assessment: 1.  Peripheral polyneuropathy bilateral lower extremities 2.  Likely anterior tibial tendon rupture left 3.  Edema left lower extremity   Plan of Care:  1. Patient evaluated.  Initially it appeared that the patient had ruptured his plantar fascia however today there is a well adhered nodule to the anteromedial aspect of the ankle likely consistent with anterior tibial tendon rupture which may have ruptured with the insertion.  This is completely asymptomatic and he has no pain however so we will not further interrogate.   2. Continue gabapentin 300 mg 3 times daily as per PCP 3.  Continue compression socks daily 4.  Continue diuretics as per PCP 5.  Continue custom molded  orthotics with diabetic shoes 6.  Return to clinic as needed  Felecia Shelling, DPM [facility]  Dr. Felecia Shelling, DPM    [address] [place], [place] [address]                Office [phone]  Fax [phone]

## 2021-12-03 ENCOUNTER — Encounter: Payer: Self-pay | Admitting: Podiatry

## 2021-12-03 ENCOUNTER — Ambulatory Visit (INDEPENDENT_AMBULATORY_CARE_PROVIDER_SITE_OTHER): Payer: Medicare Other | Admitting: Podiatry

## 2021-12-03 ENCOUNTER — Other Ambulatory Visit: Payer: Self-pay

## 2021-12-03 DIAGNOSIS — E0843 Diabetes mellitus due to underlying condition with diabetic autonomic (poly)neuropathy: Secondary | ICD-10-CM | POA: Diagnosis not present

## 2021-12-03 DIAGNOSIS — M79676 Pain in unspecified toe(s): Secondary | ICD-10-CM | POA: Diagnosis not present

## 2021-12-03 DIAGNOSIS — B351 Tinea unguium: Secondary | ICD-10-CM | POA: Diagnosis not present

## 2021-12-03 DIAGNOSIS — M6740 Ganglion, unspecified site: Secondary | ICD-10-CM

## 2021-12-03 NOTE — Progress Notes (Signed)
This patient returns to my office for at risk foot care.  This patient requires this care by a professional since this patient will be at risk due to having  Neuropathy.  This patient is unable to cut nails himself since the patient cannot reach his nails.These nails are painful walking and wearing shoes.  This patient presents for at risk foot care today.  Patient had concern about cyst left ankle.  General Appearance  Alert, conversant and in no acute stress.  Vascular  Dorsalis pedis and posterior tibial  pulses are palpable  bilaterally.  Capillary return is within normal limits  bilaterally. Temperature is within normal limits  Bilaterally. Right foot is cooler than left foot.  Neurologic  Senn-Weinstein monofilament wire test diminished   bilaterally. Muscle power within normal limits bilaterally.  Nails Thick disfigured discolored nails with subungual debris  from hallux to fifth toes bilaterally. No evidence of bacterial infection or drainage bilaterally.  Orthopedic  No limitations of motion  feet .  No crepitus or effusions noted.  No bony pathology or digital deformities noted. Hallux malleus  B/L.   Skin  normotropic skin with no porokeratosis noted bilaterally.  No signs of infections or ulcers noted.  Asymptomatic heel callus.  Asymptomatic callus sub 1  B/L. Fissured left heel.  Onychomycosis  Pain in right toes  Pain in left toes  Consent was obtained for treatment procedures.   Mechanical debridement of nails 1-5  bilaterally performed with a nail nipper.  Filed with dremel without incident.  Discussed ganglion with this patient.   Return office visit    3 months                 Told patient to return for periodic foot care and evaluation due to potential at risk complications.   Helane Gunther DPM

## 2022-01-21 ENCOUNTER — Ambulatory Visit: Payer: Medicare Other | Admitting: Dermatology

## 2022-02-17 ENCOUNTER — Other Ambulatory Visit: Payer: Self-pay | Admitting: Family Medicine

## 2022-02-17 DIAGNOSIS — M5416 Radiculopathy, lumbar region: Secondary | ICD-10-CM

## 2022-02-24 ENCOUNTER — Ambulatory Visit
Admission: RE | Admit: 2022-02-24 | Discharge: 2022-02-24 | Disposition: A | Discharge status: Home / Self Care | Payer: Medicare Other | Source: Ambulatory Visit | Attending: Family Medicine | Admitting: Family Medicine

## 2022-02-24 DIAGNOSIS — M5416 Radiculopathy, lumbar region: Secondary | ICD-10-CM

## 2022-03-11 ENCOUNTER — Ambulatory Visit (INDEPENDENT_AMBULATORY_CARE_PROVIDER_SITE_OTHER): Payer: Medicare Other | Admitting: Podiatry

## 2022-03-11 ENCOUNTER — Encounter: Payer: Self-pay | Admitting: Podiatry

## 2022-03-11 DIAGNOSIS — B351 Tinea unguium: Secondary | ICD-10-CM

## 2022-03-11 DIAGNOSIS — E0843 Diabetes mellitus due to underlying condition with diabetic autonomic (poly)neuropathy: Secondary | ICD-10-CM | POA: Diagnosis not present

## 2022-03-11 DIAGNOSIS — G629 Polyneuropathy, unspecified: Secondary | ICD-10-CM

## 2022-03-11 DIAGNOSIS — M79676 Pain in unspecified toe(s): Secondary | ICD-10-CM

## 2022-03-11 NOTE — Progress Notes (Signed)
This patient returns to my office for at risk foot care.  This patient requires this care by a professional since this patient will be at risk due to having  Neuropathy.  This patient is unable to cut nails himself since the patient cannot reach his nails.These nails are painful walking and wearing shoes.  This patient presents for at risk foot care today.  Patient had concern about cyst left ankle.  General Appearance  Alert, conversant and in no acute stress.  Vascular  Dorsalis pedis and posterior tibial  pulses are palpable  bilaterally.  Capillary return is within normal limits  bilaterally. Temperature is within normal limits  Bilaterally. Right foot is cooler than left foot.  Neurologic  Senn-Weinstein monofilament wire test diminished   bilaterally. Muscle power within normal limits bilaterally.  Nails Thick disfigured discolored nails with subungual debris  from hallux to fifth toes bilaterally. No evidence of bacterial infection or drainage bilaterally.  Orthopedic  No limitations of motion  feet .  No crepitus or effusions noted.  Hammer toes  B/L.Hallux malleus  B/L.   Skin  normotropic skin with no porokeratosis noted bilaterally.  No signs of infections or ulcers noted.  Asymptomatic heel callus.  Asymptomatic callus sub 1  B/L. Fissured left heel.  Onychomycosis  Pain in right toes  Pain in left toes  Consent was obtained for treatment procedures.   Mechanical debridement of nails 1-5  bilaterally performed with a nail nipper.  Filed with dremel without incident.  Discussed ganglion with this patient.   Return office visit    3 months                 Told patient to return for periodic foot care and evaluation due to potential at risk complications.   Aragon Scarantino DPM  

## 2022-04-01 ENCOUNTER — Ambulatory Visit (INDEPENDENT_AMBULATORY_CARE_PROVIDER_SITE_OTHER): Payer: Medicare Other | Admitting: Family Medicine

## 2022-04-01 ENCOUNTER — Encounter (INDEPENDENT_AMBULATORY_CARE_PROVIDER_SITE_OTHER): Payer: Self-pay | Admitting: Family Medicine

## 2022-04-01 VITALS — BP 109/72 | HR 60 | Temp 97.8°F | Ht 71.0 in | Wt 300.0 lb

## 2022-04-01 DIAGNOSIS — F209 Schizophrenia, unspecified: Secondary | ICD-10-CM

## 2022-04-01 DIAGNOSIS — Z1331 Encounter for screening for depression: Secondary | ICD-10-CM

## 2022-04-01 DIAGNOSIS — R9431 Abnormal electrocardiogram [ECG] [EKG]: Secondary | ICD-10-CM

## 2022-04-01 DIAGNOSIS — G4733 Obstructive sleep apnea (adult) (pediatric): Secondary | ICD-10-CM | POA: Diagnosis not present

## 2022-04-01 DIAGNOSIS — E559 Vitamin D deficiency, unspecified: Secondary | ICD-10-CM

## 2022-04-01 DIAGNOSIS — E038 Other specified hypothyroidism: Secondary | ICD-10-CM

## 2022-04-01 DIAGNOSIS — I739 Peripheral vascular disease, unspecified: Secondary | ICD-10-CM | POA: Insufficient documentation

## 2022-04-01 DIAGNOSIS — R739 Hyperglycemia, unspecified: Secondary | ICD-10-CM

## 2022-04-01 DIAGNOSIS — Z6841 Body Mass Index (BMI) 40.0 and over, adult: Secondary | ICD-10-CM

## 2022-04-01 DIAGNOSIS — R5383 Other fatigue: Secondary | ICD-10-CM | POA: Diagnosis not present

## 2022-04-01 DIAGNOSIS — R0602 Shortness of breath: Secondary | ICD-10-CM | POA: Diagnosis not present

## 2022-04-01 DIAGNOSIS — E668 Other obesity: Secondary | ICD-10-CM

## 2022-04-02 LAB — LIPID PANEL WITH LDL/HDL RATIO
Cholesterol, Total: 170 mg/dL (ref 100–199)
HDL: 49 mg/dL (ref >39)
LDL Chol Calc (NIH): 87 mg/dL (ref 0–99)
LDL/HDL Ratio: 1.8 ratio (ref 0.0–3.6)
Triglycerides: 203 mg/dL — ABNORMAL HIGH (ref 0–149)
VLDL Cholesterol Cal: 34 mg/dL (ref 5–40)

## 2022-04-02 LAB — T3: T3, Total: 139 ng/dL (ref 71–180)

## 2022-04-02 LAB — INSULIN, RANDOM: INSULIN: 11.2 u[IU]/mL (ref 2.6–24.9)

## 2022-04-02 LAB — VITAMIN D 25 HYDROXY (VIT D DEFICIENCY, FRACTURES): Vit D, 25-Hydroxy: 54.5 ng/mL (ref 30.0–100.0)

## 2022-04-02 LAB — T4, FREE: Free T4: 1.33 ng/dL (ref 0.82–1.77)

## 2022-04-12 NOTE — Progress Notes (Signed)
Chief Complaint:   OBESITY Geoffrey Cruz (MR# [id number]) is a 66 y.o. male who presents for evaluation and treatment of obesity and related comorbidities. Current BMI is Body mass index is 41.84 kg/m. Geoffrey Cruz has been struggling with his weight for many years and has been unsuccessful in either losing weight, maintaining weight loss, or reaching his healthy weight goal.  Geoffrey Cruz was referred y Dr. Leilani Cruz. He previously tried his own form of change in food intake. For breakfast he eats 2 eggs, 2 pieces of toast with butter (satisfied). Mid morning sandwich with pimento cheese (1/3 -1/2 cup), banana and water (hungry at the beginning satisfied). Lunch consist of Great grains/Cheerios (3 cups) 2% milk (1/2 cup milk) (feel satisfied). Mid afternoon chips or Ritz several handfuls. Dinner is pork chops/thicker chop 4 ounce, vegetable (1.5 cups). After supper jelly toast.   Geoffrey Cruz is currently in the action stage of change and ready to dedicate time achieving and maintaining a healthier weight. Geoffrey Cruz is interested in becoming our patient and working on intensive lifestyle modifications including (but not limited to) diet and exercise for weight loss.  Geoffrey Cruz's habits were reviewed today and are as follows: he thinks his family will eat healthier with him, his desired weight loss is 50 pounds, he has been heavy most of his life, he started gaining weight early 40's, his heaviest weight ever was 318 pounds, he has significant food cravings issues, he snacks frequently in the evenings, he wakes up frequently in the middle of the night to eat, he is frequently drinking liquids with calories, he frequently makes poor food choices, he has problems with excessive hunger, he frequently eats larger portions than normal, and he struggles with emotional eating.  Depression Screen Geoffrey Cruz's Food and Mood (modified PHQ-9) score was 13.     04/01/2022    9:09 AM  Depression screen PHQ 2/9  Decreased Interest  1  Down, Depressed, Hopeless 2  PHQ - 2 Score 3  Altered sleeping 3  Tired, decreased energy 1  Change in appetite 3  Feeling bad or failure about yourself  0  Trouble concentrating 3  Moving slowly or fidgety/restless 0  Suicidal thoughts 0  PHQ-9 Score 13  Difficult doing work/chores Not difficult at all   Subjective:   1. Other fatigue Audi admits to daytime somnolence and admits to waking up still tired. Patient has a history of symptoms of daytime fatigue and morning fatigue. Geoffrey Cruz generally gets 4 or 5 hours of sleep per night, and states that he has difficulty falling asleep and nightime awakenings. Snoring is present. Apneic episodes is present. Epworth Sleepiness Score is 10.  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   2. SOBOE (shortness of breath on exertion) Geoffrey Cruz notes increasing shortness of breath with exercising and seems to be worsening over time with weight gain. He notes getting out of breath sooner with activity than he used to. This has not gotten worse recently. Geoffrey Cruz denies shortness of breath at rest or orthopnea.   3. Hyperglycemia Geoffrey Cruz's A1C was elevated for atleast 7 years. He is currently not on medications.   4. OSA (obstructive sleep apnea) Geoffrey Cruz has a CPAP and was diagnosed at the age of 69. He uses his CPAP with 100% compliance.   5. Schizophrenia, unspecified type Geoffrey [facility] sees a doctor at [facility]. He is currently on Wellbutrin, Abilify, Fluvoxamine and Klonopin. He is feeling very well on current medications.   6. Other  specified hypothyroidism Geoffrey Cruz was diagnosed after time. He has been on psychiatric medications on Synthroid 88 mcg. His last TSH was 03/23/2022 of 4.5.  7. Abnormal EKG Geoffrey Cruz's Right bundle Cruz block on his EKG today.   8. Vitamin D deficiency Geoffrey Cruz's diagnosis is likely given to obesity.   Assessment/Plan:   1. Other fatigue Geoffrey Cruz does feel that his weight is causing his  energy to be lower than it should be. Fatigue may be related to obesity, depression or many other causes. Labs will be ordered, and in the meanwhile, Geoffrey Cruz will focus on self care including making healthy food choices, increasing physical activity and focusing on stress reduction. We will review EKG today. We will check IC and labs today.   - EKG 12-Lead  2. SOBOE (shortness of breath on exertion) Geoffrey Cruz does feel that he gets out of breath more easily that he used to when he exercises. Geoffrey Cruz's shortness of breath appears to be obesity related and exercise induced. He has agreed to work on weight loss and gradually increase exercise to treat his exercise induced shortness of breath. Will continue to monitor closely.   - Lipid Panel With LDL/HDL Ratio  3. Hyperglycemia We will check insulin today.   - Insulin, random  4. OSA (obstructive sleep apnea) Intensive lifestyle modifications are the first line treatment for this issue. Geoffrey Cruz will continue using CPAP. He was referred to cardiology for abnormal EKG and re-evaluation of OSA. We discussed several lifestyle modifications today and he will continue to work on diet, exercise and weight loss efforts. We will continue to monitor. Orders and follow up as documented in patient record.    - Ambulatory referral to Cardiology  5. Schizophrenia, unspecified type [facility]) Geoffrey Cruz will continue to follow up with his doctor at [facility].   6. Other specified hypothyroidism We will check T3 and T4 today. Orders and follow up as documented in patient record.  Counseling Good thyroid control is important for overall health. Supratherapeutic thyroid levels are dangerous and will not improve weight loss results. Counseling: The correct way to take levothyroxine is fasting, with water, separated by at least 30 minutes from breakfast, and separated by more than 4 hours from calcium, iron, multivitamins, acid reflux medications (PPIs).   -  T3 - T4, free  7. Abnormal EKG Geoffrey Cruz was referred to Cardiology today.   - Ambulatory referral to Cardiology  8. Vitamin D deficiency Low Vitamin D level contributes to fatigue and are associated with obesity, breast, and colon cancer. We will check Vitamin D level today and Geoffrey Cruz will follow-up for routine testing of Vitamin D, at least 2-3 times per year to avoid over-replacement.  - VITAMIN D 25 Hydroxy (Vit-D Deficiency, Fractures)  9. Depression screening Geoffrey Cruz had a positive depression screening. Depression is commonly associated with obesity and often results in emotional eating behaviors. We will monitor this closely and work on CBT to help improve the non-hunger eating patterns. Referral to Psychology may be required if no improvement is seen as he continues in our clinic.   10. Class 3 severe obesity with serious comorbidity and body mass index (BMI) of 40.0 to 44.9 in adult, unspecified obesity type Geoffrey Cruz is currently in the action stage of change and his goal is to continue with weight loss efforts. I recommend Geoffrey Cruz begin the structured treatment plan as follows:  He has agreed to the Category 4 Plan.  Exercise goals: No exercise has been prescribed at this time.  Behavioral modification strategies: increasing lean protein intake, meal planning and cooking strategies, keeping healthy foods in the home, and planning for success.  He was informed of the importance of frequent follow-up visits to maximize his success with intensive lifestyle modifications for his multiple health conditions. He was informed we would discuss his lab results at his next visit unless there is a critical issue that needs to be addressed sooner. Geoffrey Cruz agreed to keep his next visit at the agreed upon time to discuss these results.  Objective:   Blood pressure 109/72, pulse 60, temperature 97.8 F (36.6 C), height 5\' 11"  (1.803 m), weight 300 lb (136.1 kg), SpO2 98 %. Body mass index is  41.84 kg/m.  EKG: Normal sinus rhythm, rate 79 bpm.  Indirect Calorimeter completed today shows a VO2 of 328 and a REE of 2261.  His calculated basal metabolic rate is [id number] thus his basal metabolic rate is worse than expected.  General: Cooperative, alert, well developed, in no acute distress. HEENT: Conjunctivae and lids unremarkable. Cardiovascular: Regular rhythm.  Lungs: Normal work of breathing. Neurologic: No focal deficits.   No results found for: CREATININE, BUN, NA, K, CL, CO2 No results found for: ALT, AST, GGT, ALKPHOS, BILITOT No results found for: HGBA1C Lab Results  Component Value Date   INSULIN 11.2 04/01/2022   No results found for: TSH Lab Results  Component Value Date   CHOL 170 04/01/2022   HDL 49 04/01/2022   LDLCALC 87 04/01/2022   TRIG 203 (H) 04/01/2022   Lab Results  Component Value Date   WBC 10.7 (H) 12/26/2013   HGB 16.3 12/26/2013   HCT 48.5 12/26/2013   MCV 91 12/26/2013   PLT 196 12/26/2013   No results found for: IRON, TIBC, FERRITIN   Attestation Statements:   Reviewed by clinician on day of visit: allergies, medications, problem list, medical history, surgical history, family history, social history, and previous encounter notes.  I, Jackson LatinoAlisa White, RMA, am acting as Energy managertranscriptionist for Reuben LikesAlexandria Kristl Morioka, MD.  This is the patient's first visit at [facility]. The patient's NEW PATIENT PACKET was reviewed at length. Included in the packet: current and past health history, medications, allergies, ROS, gynecologic history (women only), surgical history, family history, social history, weight history, weight loss surgery history (for those that have had weight loss surgery), nutritional evaluation, mood and food questionnaire, PHQ9, Epworth questionnaire, sleep habits questionnaire, patient life and health improvement goals questionnaire. These will all be scanned into the patient's chart under media.   During the visit, I  independently reviewed the patient's EKG, bioimpedance scale results, and indirect calorimeter results. I used this information to tailor a meal plan for the patient that will help him to lose weight and will improve his obesity-related conditions going forward. I performed a medically necessary appropriate examination and/or evaluation. I discussed the assessment and treatment plan with the patient. The patient was provided an opportunity to ask questions and all were answered. The patient agreed with the plan and demonstrated an understanding of the instructions. Labs were ordered at this visit and will be reviewed at the next visit unless more critical results need to be addressed immediately. Clinical information was updated and documented in the EMR.   Time spent on visit including pre-visit chart review and post-visit care was 60 minutes.   I have reviewed the above documentation for accuracy and completeness, and I agree with the above. - Reuben LikesAlexandria Bee Hammerschmidt, MD

## 2022-04-15 ENCOUNTER — Ambulatory Visit (INDEPENDENT_AMBULATORY_CARE_PROVIDER_SITE_OTHER): Payer: Medicare Other | Admitting: Family Medicine

## 2022-04-29 ENCOUNTER — Ambulatory Visit (INDEPENDENT_AMBULATORY_CARE_PROVIDER_SITE_OTHER): Payer: Medicare Other | Admitting: Bariatrics

## 2022-04-29 ENCOUNTER — Other Ambulatory Visit (INDEPENDENT_AMBULATORY_CARE_PROVIDER_SITE_OTHER): Payer: Self-pay | Admitting: Bariatrics

## 2022-04-29 VITALS — BP 128/78 | HR 66 | Temp 97.7°F | Ht 71.0 in | Wt 296.0 lb

## 2022-04-29 DIAGNOSIS — Z6841 Body Mass Index (BMI) 40.0 and over, adult: Secondary | ICD-10-CM

## 2022-04-29 DIAGNOSIS — R7303 Prediabetes: Secondary | ICD-10-CM

## 2022-04-29 DIAGNOSIS — E669 Obesity, unspecified: Secondary | ICD-10-CM

## 2022-04-29 DIAGNOSIS — E038 Other specified hypothyroidism: Secondary | ICD-10-CM | POA: Diagnosis not present

## 2022-04-29 DIAGNOSIS — G4739 Other sleep apnea: Secondary | ICD-10-CM | POA: Diagnosis not present

## 2022-04-29 MED ORDER — METFORMIN HCL 500 MG PO TABS: 500.0000 mg | ORAL_TABLET | Freq: Every day | ORAL | 0 refills | Status: AC

## 2022-05-01 ENCOUNTER — Other Ambulatory Visit (INDEPENDENT_AMBULATORY_CARE_PROVIDER_SITE_OTHER): Payer: Self-pay | Admitting: Bariatrics

## 2022-05-01 DIAGNOSIS — R7303 Prediabetes: Secondary | ICD-10-CM

## 2022-05-05 ENCOUNTER — Encounter (INDEPENDENT_AMBULATORY_CARE_PROVIDER_SITE_OTHER): Payer: Self-pay | Admitting: Bariatrics

## 2022-05-05 NOTE — Progress Notes (Signed)
Chief Complaint:   OBESITY Geoffrey Cruz is here to discuss his progress with his obesity treatment plan along with follow-up of his obesity related diagnoses. Geoffrey Cruz is on the Category 4 Plan and states he is following his eating plan approximately 70% of the time. Geoffrey Cruz states he is walking for 20 minutes 7 times per week.  Today's visit was #: 2 Starting weight: 300 lbs Starting date: 04/01/2022 Today's weight: 296 lbs Today's date: 04/29/2022 Total lbs lost to date: 4 lbs Total lbs lost since last in-office visit: 4 lbs  Interim History: Geoffrey Cruz is down 4 lbs since his first visit. He is happy with his weight loss.   Subjective:   1. Pre-diabetes Geoffrey Cruz's last insulin was 11.2. His A1C previously was 5.9.  2. Other specified hypothyroidism Geoffrey Cruz is currently taking Synthroid.   3. Other sleep apnea Geoffrey Cruz uses a CPAP nightly. He states he sleeps quietly.   Assessment/Plan:   1. Pre-diabetes Geoffrey Cruz will continue to decrease sweets and starches. Metformin handout was given today. We will refill Metformin 500 mg for 1 month with no refills. Additional lunch and dinner options (protein substitutes) was provided. He will continue to work on weight loss, exercise, and decreasing simple carbohydrates to help decrease the risk of diabetes.   - metFORMIN (GLUCOPHAGE) 500 MG tablet; Take 1 tablet (500 mg total) by mouth daily with breakfast.  Dispense: 30 tablet; Refill: 0  2. Other specified hypothyroidism Geoffrey Cruz will continue taking Synthroid. Orders and follow up as documented in patient record.  Counseling Good thyroid control is important for overall health. Supratherapeutic thyroid levels are dangerous and will not improve weight loss results. Counseling: The correct way to take levothyroxine is fasting, with water, separated by at least 30 minutes from breakfast, and separated by more than 4 hours from calcium, iron, multivitamins, acid reflux medications (PPIs).    3. Other  sleep apnea Geoffrey Cruz will continue using his CPAP nightly. Intensive lifestyle modifications are the first line treatment for this issue. We discussed several lifestyle modifications today and he will continue to work on diet, exercise and weight loss efforts. We will continue to monitor. Orders and follow up as documented in patient record.    4. Obesity, Current BMI 41.4 Geoffrey Cruz is currently in the action stage of change. As such, his goal is to continue with weight loss efforts. He has agreed to the Category 4 Plan.   Geoffrey Cruz will adhere closely to the plan at 80-90%. We reviewed labs lipids, Vitamin D, insulin, and thyroid panel.   Exercise goals:  As is.   Behavioral modification strategies: increasing lean protein intake, decreasing simple carbohydrates, increasing vegetables, increasing water intake, decreasing eating out, no skipping meals, meal planning and cooking strategies, keeping healthy foods in the home, and planning for success.  Geoffrey Cruz has agreed to follow-up with our clinic in 2 weeks. He was informed of the importance of frequent follow-up visits to maximize his success with intensive lifestyle modifications for his multiple health conditions.   Objective:   Blood pressure 128/78, pulse 66, temperature 97.7 F (36.5 C), height 5\' 11"  (1.803 m), weight 296 lb (134.3 kg), SpO2 98 %. Body mass index is 41.28 kg/m.  General: Cooperative, alert, well developed, in no acute distress. HEENT: Conjunctivae and lids unremarkable. Cardiovascular: Regular rhythm.  Lungs: Normal work of breathing. Neurologic: No focal deficits.   No results found for: CREATININE, BUN, NA, K, CL, CO2 No results found for: ALT, AST, GGT, ALKPHOS, BILITOT No results  found for: HGBA1C Lab Results  Component Value Date   INSULIN 11.2 04/01/2022   No results found for: TSH Lab Results  Component Value Date   CHOL 170 04/01/2022   HDL 49 04/01/2022   LDLCALC 87 04/01/2022   TRIG 203 (H) 04/01/2022    Lab Results  Component Value Date   VD25OH 54.5 04/01/2022   Lab Results  Component Value Date   WBC 10.7 (H) 12/26/2013   HGB 16.3 12/26/2013   HCT 48.5 12/26/2013   MCV 91 12/26/2013   PLT 196 12/26/2013   No results found for: IRON, TIBC, FERRITIN  Attestation Statements:   Reviewed by clinician on day of visit: allergies, medications, problem list, medical history, surgical history, family history, social history, and previous encounter notes.  I, Jackson Latino, RMA, am acting as Energy manager for [employer], DO.  I have reviewed the above documentation for accuracy and completeness, and I agree with the above. Corinna Capra, DO

## 2022-05-06 ENCOUNTER — Ambulatory Visit: Payer: Medicare Other | Admitting: Cardiology

## 2022-05-13 NOTE — Progress Notes (Signed)
Cardiology Office Note  Date:  05/14/2022   ID:  Geoffrey Cruz, DOB 1956/05/07, MRN [id number]  PCP:  Gracelyn Nurse, MD   Chief Complaint  Patient presents with   Other    Cardiac hx ABN EKG/OSA no complaints today. Meds reviewed verbally with pt.    HPI:  [name] is a 66 year old gentleman with past medical history of Morbid obesity Hyperglycemia, A1c 5.7 Sleep apnea on bipap Schizophrenia/bipolar Who presents by referral from Dr. 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, shortness of breath, sleep apnea  Walks daily 1/2 mile a day, daily Little SOB, "not too bad" Used to work with weight and wellness, no longer He is doing things on his own now, " they tell me what to do" Moderating diet  Wears compression hose for mild lower extremity edema Feels the swelling is stable No PND orthopnea  Total chol 170, LDL 87, A1C 5.9 Normal BMP  EKG personally reviewed by myself on todays visit NSR rate 65 bpm with RBBB, LAFB   PMH:   has a past medical history of Anxiety, Bipolar disorder (HCC), Constipation, Depression, History of prostatitis, Hyperlipidemia, Hypogonadism in male, Hypothyroidism, Neuropathy, Psoriasis, Schizophrenia (HCC), Sleep apnea, Swelling, and Thyroid disease.  PSH:    Past Surgical History:  Procedure Laterality Date   BACK SURGERY  2012   COLONOSCOPY     COLONOSCOPY WITH PROPOFOL N/A 12/19/2018   Procedure: COLONOSCOPY WITH PROPOFOL;  Surgeon: Toledo, Boykin Nearing, MD;  Location: [facility] ENDOSCOPY;  Service: Gastroenterology;  Laterality: N/A;   KIDNEY SURGERY     KNEE ARTHROSCOPY     POSTERIOR FUSION LUMBAR SPINE     WISDOM TOOTH EXTRACTION      Current Outpatient Medications  Medication Sig Dispense Refill   ARIPiprazole (ABILIFY) 20 MG tablet Take 20 mg by mouth daily.     buPROPion (WELLBUTRIN SR) 200 MG 12 hr tablet Take 200 mg by mouth 2 (two) times daily.     clonazePAM  (KLONOPIN) 0.5 MG tablet Take 0.5 mg by mouth 2 (two) times daily.     fluvoxaMINE (LUVOX) 100 MG tablet Take 100 mg by mouth 2 (two) times daily.     furosemide (LASIX) 40 MG tablet Take 40 mg by mouth daily.     gabapentin (NEURONTIN) 400 MG capsule Take 400 mg by mouth 3 (three) times daily.     levothyroxine (SYNTHROID, LEVOTHROID) 88 MCG tablet Take 88 mcg by mouth daily before breakfast.     metFORMIN (GLUCOPHAGE) 500 MG tablet Take 1 tablet (500 mg total) by mouth daily with breakfast. 30 tablet 0   naproxen (NAPROSYN) 500 MG tablet Take 500 mg by mouth 2 (two) times daily with a meal.     No current facility-administered medications for this visit.     Allergies:   Hydrocodone-acetaminophen, Propoxyphene, and Codeine   Social History:  The patient  reports that he quit smoking about 33 years ago. His smoking use included cigarettes. He has never used smokeless tobacco. He reports that he does not drink alcohol and does not use drugs.   Family History:   family history includes Cancer in his mother; Depression in his mother; Diabetes in his father; Heart disease in his father; Heart failure in his brother and father; Hyperlipidemia in his mother; Hypertension in his father.    Review of Systems: Review of Systems  Constitutional: Negative.   HENT: Negative.    Respiratory: Negative.  Cardiovascular: Negative.   Gastrointestinal: Negative.   Musculoskeletal: Negative.   Neurological: Negative.   Psychiatric/Behavioral: Negative.    All other systems reviewed and are negative.   PHYSICAL EXAM: VS:  BP 108/70 (BP Location: Right Arm, Patient Position: Sitting, Cuff Size: Large)   Pulse 65   Ht 5\' 11"  (1.803 m)   Wt (!) 301 lb 4 oz (136.6 kg)   SpO2 95%   BMI 42.02 kg/m  , BMI Body mass index is 42.02 kg/m. GEN: Well nourished, well developed, in no acute distress HEENT: normal Neck: no JVD, carotid bruits, or masses Cardiac: RRR; no murmurs, rubs, or gallops,no edema   Respiratory:  clear to auscultation bilaterally, normal work of breathing GI: soft, nontender, nondistended, + BS MS: no deformity or atrophy Skin: warm and dry, no rash Neuro:  Strength and sensation are intact Psych: euthymic mood, full affect  Recent Labs: No results found for requested labs within last 365 days.    Lipid Panel Lab Results  Component Value Date   CHOL 170 04/01/2022   HDL 49 04/01/2022   LDLCALC 87 04/01/2022   TRIG 203 (H) 04/01/2022      Wt Readings from Last 3 Encounters:  05/14/22 (!) 301 lb 4 oz (136.6 kg)  04/29/22 296 lb (134.3 kg)  04/01/22 300 lb (136.1 kg)     ASSESSMENT AND PLAN:  Problem List Items Addressed This Visit     Obesity, morbid (HCC)   Chronic hyperglycemia   Apnea, sleep   Other Visit Diagnoses     Abnormal EKG    -  Primary   Relevant Orders   EKG 12-Lead   RBBB       Relevant Orders   EKG 12-Lead   LAFB (left anterior fascicular block)       Relevant Orders   EKG 12-Lead   SOB (shortness of breath)       Relevant Orders   EKG 12-Lead   Leg edema          Abnormal EKG No prior studies available to compare in past years Mild shortness of breath, mild leg edema Right bundle branch block noted, left anterior fascicular block Echocardiogram ordered to rule out structural heart disease  Leg edema Maintained on Lasix 40 daily Likely component of diastolic dysfunction in the setting of morbid obesity Echocardiogram ordered  Shortness of breath Reports it is mild, recommend he continue his exercise program and if symptoms get worse call our office for further work-up Baseline echo ordered    Total encounter time more than 60 minutes  Greater than 50% was spent in counseling and coordination of care with the patient  Patient seen in consultation for Dr. 04/03/22 will be referred back to her office for ongoing care of the issues detailed above  Signed, Lawson Radar, M.D., Ph.D. [facility]  [place], [place]

## 2022-05-14 ENCOUNTER — Encounter: Payer: Self-pay | Admitting: Cardiovascular Disease

## 2022-05-14 ENCOUNTER — Ambulatory Visit (INDEPENDENT_AMBULATORY_CARE_PROVIDER_SITE_OTHER): Payer: Medicare Other | Admitting: Cardiovascular Disease

## 2022-05-14 VITALS — BP 108/70 | HR 65 | Ht 71.0 in | Wt 301.2 lb

## 2022-05-14 DIAGNOSIS — I4510 Unspecified right bundle-branch block: Secondary | ICD-10-CM | POA: Diagnosis not present

## 2022-05-14 DIAGNOSIS — I444 Left anterior fascicular block: Secondary | ICD-10-CM | POA: Diagnosis not present

## 2022-05-14 DIAGNOSIS — R9431 Abnormal electrocardiogram [ECG] [EKG]: Secondary | ICD-10-CM

## 2022-05-14 DIAGNOSIS — G4733 Obstructive sleep apnea (adult) (pediatric): Secondary | ICD-10-CM | POA: Diagnosis not present

## 2022-05-14 DIAGNOSIS — R739 Hyperglycemia, unspecified: Secondary | ICD-10-CM | POA: Diagnosis not present

## 2022-05-14 DIAGNOSIS — R600 Localized edema: Secondary | ICD-10-CM

## 2022-05-14 DIAGNOSIS — R0602 Shortness of breath: Secondary | ICD-10-CM | POA: Diagnosis not present

## 2022-05-14 NOTE — Patient Instructions (Addendum)
Medication Instructions:  No changes  If you need a refill on your cardiac medications before your next appointment, please call your pharmacy.   Lab work: No new labs needed  Testing/Procedures:  1) Echocardiogram: - Your physician has requested that you have an echocardiogram. Echocardiography is a painless test that uses sound waves to create images of your heart. It provides your doctor with information about the size and shape of your heart and how well your heart's chambers and valves are working. This procedure takes approximately one hour. There are no restrictions for this procedure. There is a possibility that an IV may need to be started during your test to inject an image enhancing agent. This is done to obtain more optimal pictures of your heart. Therefore we ask that you do at least drink some water prior to coming in to hydrate your veins.     Follow-Up: At [facility], you and your health needs are our priority.  As part of our continuing mission to provide you with exceptional heart care, we have created designated Provider Care Teams.  These Care Teams include your primary Cardiologist (physician) and Advanced Practice Providers (APPs -  Physician Assistants and Nurse Practitioners) who all work together to provide you with the care you need, when you need it.  You will need a follow up appointment as needed  Providers on your designated Care Team:   Nicolasa Ducking, NP Eula Listen, PA-C Cadence Fransico Michael, [place]  COVID-19 Vaccine Information can be found at: [email or web address] For questions related to vaccine distribution or appointments, please email vaccine@ .com or call [phone].    Echocardiogram An echocardiogram is a test that uses sound waves (ultrasound) to produce images of the heart. Images from an echocardiogram can provide important information about: Heart size and shape. The size and  thickness and movement of your heart's walls. Heart muscle function and strength. Heart valve function or if you have stenosis. Stenosis is when the heart valves are too narrow. If blood is flowing backward through the heart valves (regurgitation). A tumor or infectious growth around the heart valves. Areas of heart muscle that are not working well because of poor blood flow or injury from a heart attack. Aneurysm detection. An aneurysm is a weak or damaged part of an artery wall. The wall bulges out from the normal force of blood pumping through the body. Tell a health care provider about: Any allergies you have. All medicines you are taking, including vitamins, herbs, eye drops, creams, and over-the-counter medicines. Any blood disorders you have. Any surgeries you have had. Any medical conditions you have. Whether you are pregnant or may be pregnant. What are the risks? Generally, this is a safe test. However, problems may occur, including an allergic reaction to dye (contrast) that may be used during the test. What happens before the test? No specific preparation is needed. You may eat and drink normally. What happens during the test?  You will take off your clothes from the waist up and put on a hospital gown. Electrodes or electrocardiogram (ECG)patches may be placed on your chest. The electrodes or patches are then connected to a device that monitors your heart rate and rhythm. You will lie down on a table for an ultrasound exam. A gel will be applied to your chest to help sound waves pass through your skin. A handheld device, called a transducer, will be pressed against your chest and moved over your heart. The transducer produces sound waves that travel to your  heart and bounce back (or "echo" back) to the transducer. These sound waves will be captured in real-time and changed into images of your heart that can be viewed on a video monitor. The images will be recorded on a computer  and reviewed by your health care provider. You may be asked to change positions or hold your breath for a short time. This makes it easier to get different views or better views of your heart. In some cases, you may receive contrast through an IV in one of your veins. This can improve the quality of the pictures from your heart. The procedure may vary among health care providers and hospitals. What can I expect after the test? You may return to your normal, everyday life, including diet, activities, and medicines, unless your health care provider tells you not to do that. Follow these instructions at home: It is up to you to get the results of your test. Ask your health care provider, or the department that is doing the test, when your results will be ready. Keep all follow-up visits. This is important. Summary An echocardiogram is a test that uses sound waves (ultrasound) to produce images of the heart. Images from an echocardiogram can provide important information about the size and shape of your heart, heart muscle function, heart valve function, and other possible heart problems. You do not need to do anything to prepare before this test. You may eat and drink normally. After the echocardiogram is completed, you may return to your normal, everyday life, unless your health care provider tells you not to do that. This information is not intended to replace advice given to you by your health care provider. Make sure you discuss any questions you have with your health care provider. Document Revised: 08/05/2021 Document Reviewed: 07/15/2020 Elsevier Patient Education  2023 [employer].

## 2022-05-18 ENCOUNTER — Ambulatory Visit (INDEPENDENT_AMBULATORY_CARE_PROVIDER_SITE_OTHER): Payer: Medicare Other | Admitting: Bariatrics

## 2022-06-10 ENCOUNTER — Ambulatory Visit (INDEPENDENT_AMBULATORY_CARE_PROVIDER_SITE_OTHER): Payer: Medicare Other | Admitting: Podiatry

## 2022-06-10 ENCOUNTER — Encounter: Payer: Self-pay | Admitting: Podiatry

## 2022-06-10 DIAGNOSIS — M79676 Pain in unspecified toe(s): Secondary | ICD-10-CM | POA: Diagnosis not present

## 2022-06-10 DIAGNOSIS — G629 Polyneuropathy, unspecified: Secondary | ICD-10-CM

## 2022-06-10 DIAGNOSIS — E0843 Diabetes mellitus due to underlying condition with diabetic autonomic (poly)neuropathy: Secondary | ICD-10-CM | POA: Diagnosis not present

## 2022-06-10 DIAGNOSIS — B351 Tinea unguium: Secondary | ICD-10-CM

## 2022-06-10 NOTE — Progress Notes (Signed)
This patient returns to my office for at risk foot care.  This patient requires this care by a professional since this patient will be at risk due to having  Neuropathy.  This patient is unable to cut nails himself since the patient cannot reach his nails.These nails are painful walking and wearing shoes.  This patient presents for at risk foot care today.  Patient had concern about cyst left ankle.  General Appearance  Alert, conversant and in no acute stress.  Vascular  Dorsalis pedis and posterior tibial  pulses are palpable  bilaterally.  Capillary return is within normal limits  bilaterally. Temperature is within normal limits  Bilaterally. Right foot is cooler than left foot.  Neurologic  Senn-Weinstein monofilament wire test diminished   bilaterally. Muscle power within normal limits bilaterally.  Nails Thick disfigured discolored nails with subungual debris  from hallux to fifth toes bilaterally. No evidence of bacterial infection or drainage bilaterally.  Orthopedic  No limitations of motion  feet .  No crepitus or effusions noted.  Hammer toes  B/L.Hallux malleus  B/L.   Skin  normotropic skin with no porokeratosis noted bilaterally.  No signs of infections or ulcers noted.  Asymptomatic heel callus.  Asymptomatic callus sub 1  B/L. Fissured left heel.  Onychomycosis  Pain in right toes  Pain in left toes  Consent was obtained for treatment procedures.   Mechanical debridement of nails 1-5  bilaterally performed with a nail nipper.  Filed with dremel without incident.  Discussed ganglion with this patient.   Return office visit    3 months                 Told patient to return for periodic foot care and evaluation due to potential at risk complications.   Helane Gunther DPM

## 2022-06-14 ENCOUNTER — Ambulatory Visit (INDEPENDENT_AMBULATORY_CARE_PROVIDER_SITE_OTHER): Payer: Medicare Other

## 2022-06-14 DIAGNOSIS — R0602 Shortness of breath: Secondary | ICD-10-CM

## 2022-06-14 DIAGNOSIS — I4510 Unspecified right bundle-branch block: Secondary | ICD-10-CM | POA: Diagnosis not present

## 2022-06-14 LAB — ECHOCARDIOGRAM COMPLETE
AR max vel: 4.88 cm2
AV Area VTI: 4.99 cm2
AV Area mean vel: 5.1 cm2
AV Mean grad: 3.5 mmHg
AV Peak grad: 6.6 mmHg
Ao pk vel: 1.29 m/s
Area-P 1/2: 3.19 cm2
S' Lateral: 3.7 cm

## 2022-06-14 MED ORDER — PERFLUTREN LIPID MICROSPHERE
1.0000 mL | INTRAVENOUS | Status: AC | PRN
  Administered 2022-06-14: 2 mL via INTRAVENOUS

## 2022-06-16 ENCOUNTER — Telehealth (INDEPENDENT_AMBULATORY_CARE_PROVIDER_SITE_OTHER): Payer: Self-pay | Admitting: Bariatrics

## 2022-06-16 ENCOUNTER — Telehealth: Payer: Self-pay | Admitting: Emergency Medicine

## 2022-06-16 NOTE — Telephone Encounter (Signed)
Received call from Tomi at [facility] in regards to metformin. Please advise if pt will continue taking as prescribed and if a refill will be given for patient.  LAST written 04/29/22 with NO refills. Tomi, [phone]. Please leave a message if no answer

## 2022-06-16 NOTE — Telephone Encounter (Signed)
Called patient. No answer. Detailed message left per DPR. Patient aware that he can call with questions or concerns.

## 2022-06-16 NOTE — Telephone Encounter (Signed)
-----   Message from Antonieta Iba, MD sent at 06/16/2022  8:11 AM EDT ----- Echocardiogram Normal left and right ventricular function No significant valvular heart disease No other significant findings noted

## 2022-06-17 NOTE — Telephone Encounter (Signed)
Last OV with Dr Brown 

## 2022-09-13 ENCOUNTER — Ambulatory Visit (INDEPENDENT_AMBULATORY_CARE_PROVIDER_SITE_OTHER): Payer: Medicare Other | Admitting: Podiatry

## 2022-09-13 ENCOUNTER — Encounter: Payer: Self-pay | Admitting: Podiatry

## 2022-09-13 DIAGNOSIS — B351 Tinea unguium: Secondary | ICD-10-CM

## 2022-09-13 DIAGNOSIS — M79676 Pain in unspecified toe(s): Secondary | ICD-10-CM

## 2022-09-13 DIAGNOSIS — E0843 Diabetes mellitus due to underlying condition with diabetic autonomic (poly)neuropathy: Secondary | ICD-10-CM

## 2022-09-13 DIAGNOSIS — G629 Polyneuropathy, unspecified: Secondary | ICD-10-CM

## 2022-09-13 NOTE — Progress Notes (Signed)
This patient returns to my office for at risk foot care.  This patient requires this care by a professional since this patient will be at risk due to having  Neuropathy.  This patient is unable to cut nails himself since the patient cannot reach his nails.These nails are painful walking and wearing shoes.  This patient presents for at risk foot care today.  Patient had concern about cyst left ankle.  General Appearance  Alert, conversant and in no acute stress.  Vascular  Dorsalis pedis and posterior tibial  pulses are palpable  bilaterally.  Capillary return is within normal limits  bilaterally. Temperature is within normal limits  Bilaterally. Right foot is cooler than left foot.  Neurologic  Senn-Weinstein monofilament wire test diminished   bilaterally. Muscle power within normal limits bilaterally.  Nails Thick disfigured discolored nails with subungual debris  from hallux to fifth toes bilaterally. No evidence of bacterial infection or drainage bilaterally.  Orthopedic  No limitations of motion  feet .  No crepitus or effusions noted.  Hammer toes  B/L.Hallux malleus  B/L.   Skin  normotropic skin with no porokeratosis noted bilaterally.  No signs of infections or ulcers noted.  Asymptomatic heel callus.  Asymptomatic callus sub 1  B/L.   Onychomycosis  Pain in right toes  Pain in left toes  Consent was obtained for treatment procedures.   Mechanical debridement of nails 1-5  bilaterally performed with a nail nipper.  Filed with dremel without incident.     Return office visit    3 months                 Told patient to return for periodic foot care and evaluation due to potential at risk complications.   Gardiner Barefoot DPM

## 2022-10-18 ENCOUNTER — Emergency Department
Admission: EM | Admit: 2022-10-18 | Discharge: 2022-10-18 | Disposition: A | Discharge status: Home / Self Care | Payer: Medicare Other | Attending: Emergency Medicine | Admitting: Emergency Medicine

## 2022-10-18 ENCOUNTER — Emergency Department: Payer: Medicare Other

## 2022-10-18 DIAGNOSIS — R55 Syncope and collapse: Secondary | ICD-10-CM | POA: Insufficient documentation

## 2022-10-18 DIAGNOSIS — R42 Dizziness and giddiness: Secondary | ICD-10-CM | POA: Diagnosis not present

## 2022-10-18 DIAGNOSIS — E039 Hypothyroidism, unspecified: Secondary | ICD-10-CM | POA: Diagnosis not present

## 2022-10-18 DIAGNOSIS — R519 Headache, unspecified: Secondary | ICD-10-CM | POA: Insufficient documentation

## 2022-10-18 LAB — COMPREHENSIVE METABOLIC PANEL
ALT: 26 U/L (ref 0–44)
AST: 23 U/L (ref 15–41)
Albumin: 3.9 g/dL (ref 3.5–5.0)
Alkaline Phosphatase: 114 U/L (ref 38–126)
Anion gap: 7 (ref 5–15)
BUN: 16 mg/dL (ref 8–23)
CO2: 26 mmol/L (ref 22–32)
Calcium: 9.3 mg/dL (ref 8.9–10.3)
Chloride: 106 mmol/L (ref 98–111)
Creatinine, Ser: 1 mg/dL (ref 0.61–1.24)
GFR, Estimated: >60 mL/min (ref >60)
Glucose, Bld: 112 mg/dL — ABNORMAL HIGH (ref 70–99)
Potassium: 3.6 mmol/L (ref 3.5–5.1)
Sodium: 139 mmol/L (ref 135–145)
Total Bilirubin: 0.6 mg/dL (ref 0.3–1.2)
Total Protein: 7.5 g/dL (ref 6.5–8.1)

## 2022-10-18 LAB — CBC WITH DIFFERENTIAL/PLATELET
Abs Immature Granulocytes: 0.08 10*3/uL — ABNORMAL HIGH (ref 0.00–0.07)
Basophils Absolute: 0.1 10*3/uL (ref 0.0–0.1)
Basophils Relative: 1 %
Eosinophils Absolute: 0.3 10*3/uL (ref 0.0–0.5)
Eosinophils Relative: 3 %
HCT: 44.1 % (ref 39.0–52.0)
Hemoglobin: 15.2 g/dL (ref 13.0–17.0)
Immature Granulocytes: 1 %
Lymphocytes Relative: 19 %
Lymphs Abs: 1.8 10*3/uL (ref 0.7–4.0)
MCH: 30.6 pg (ref 26.0–34.0)
MCHC: 34.5 g/dL (ref 30.0–36.0)
MCV: 88.7 fL (ref 80.0–100.0)
Monocytes Absolute: 0.9 10*3/uL (ref 0.1–1.0)
Monocytes Relative: 10 %
Neutro Abs: 6.3 10*3/uL (ref 1.7–7.7)
Neutrophils Relative %: 66 %
Platelets: 246 10*3/uL (ref 150–400)
RBC: 4.97 MIL/uL (ref 4.22–5.81)
RDW: 13.6 % (ref 11.5–15.5)
WBC: 9.4 10*3/uL (ref 4.0–10.5)
nRBC: 0 % (ref 0.0–0.2)

## 2022-10-18 NOTE — ED Notes (Signed)
ED Provider at bedside. 

## 2022-10-18 NOTE — ED Provider Notes (Signed)
[facility] Provider Note    Event Date/Time   First MD Initiated Contact with Patient 10/18/22 [phone]     (approximate)   History   Loss of Consciousness (BIBA for syncopal episode this morning after standing up falling and hitting the back of his head. - Anti-coagulants. )   HPI  Geoffrey Cruz is a 66 y.o. male past medical history of bipolar disorder, schizophrenia, sleep apnea, anxiety, depression, hyperlipidemia who presents after syncopal episode.  Patient was putting his laundry in the dryer when it dropped.  He went to pick up his laundry and then stood up quickly.  He then was walking across the room when he felt very lightheaded and dizzy.  He then syncopized.  Did hit his head.  Apparently this only lasted for several seconds and when he woke he felt back to baseline denies any preceding chest pain or shortness of breath.  No recent illnesses eating and drinking normally no nausea vomiting diarrhea or fevers.  Patient does endorse mild headache and some throbbing in the posterior occiput since the fall.  No vision change numbness tingling weakness.  Does endorse pain over the left temple its been going on for about 7 days.  Comes and goes.  Seems to be worse when he is doing a lot and improves when he rest.  He denies any pain currently.  No pain with eating or drinking.     Past Medical History:  Diagnosis Date   Anxiety    Bipolar disorder ([place])    Constipation    Depression    History of prostatitis    Hyperlipidemia    Hypogonadism in male    Hypothyroidism    Neuropathy    Psoriasis    Schizophrenia ([place])    Sleep apnea    Swelling    Thyroid disease     Patient Active Problem List   Diagnosis Date Noted   Ganglion 12/03/2021   Heel callus 09/03/2021   Neuropathy 03/27/2019   Obesity, morbid ([place]) 10/05/2017   Abdominal pain, RLQ (right lower quadrant) 09/05/2017   Hernia of abdominal wall 09/05/2017   Colon cancer screening  07/20/2017   Clinical depression 04/20/2016   Personal history of other diseases of male genital organs 04/20/2016   Adult hypothyroidism 04/20/2016   Schizophrenia ([place]) 04/20/2016   Apnea, sleep 04/20/2016   Chronic hyperglycemia 12/30/2014     Physical Exam  Triage Vital Signs: ED Triage Vitals  Enc Vitals Group     BP      Pulse      Resp      Temp      Temp src      SpO2      Weight      Height      Head Circumference      Peak Flow      Pain Score      Pain Loc      Pain Edu?      Excl. in [place]?     Most recent vital signs: Vitals:   10/18/22 0659  BP: (!) 154/62  Pulse: 61  Resp: 13  SpO2: 99%     General: Awake, no distress.  CV:  Good peripheral perfusion.  Resp:  Normal effort.  Abd:  No distention.  Neuro:             Awake, Alert, Oriented x 3  Other:  Swelling noted over the posterior occiput No temporal tenderness  No C spine tenderness  Aox3, nml speech  PERRL, EOMI, face symmetric, nml tongue movement  5/5 strength in the BL upper and lower extremities  Sensation grossly intact in the BL upper and lower extremities  Finger-nose-finger intact BL    ED Results / Procedures / Treatments  Labs (all labs ordered are listed, but only abnormal results are displayed) Labs Reviewed  COMPREHENSIVE METABOLIC PANEL - Abnormal; Notable for the following components:      Result Value   Glucose, Bld 112 (*)    All other components within normal limits  CBC WITH DIFFERENTIAL/PLATELET - Abnormal; Notable for the following components:   Abs Immature Granulocytes 0.08 (*)    All other components within normal limits     EKG  EKG reviewed interpreted myself shows 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   MEDICATIONS ORDERED IN ED: Medications - No data to display   IMPRESSION / MDM / [place] / ED COURSE  I reviewed the triage vital signs and the nursing notes.                              Patient's presentation is most consistent with acute presentation with potential threat to life or bodily function.  Differential diagnosis includes, but is not limited to, vasovagal episode, orthostatic hypotension, less likely cardiac arrhythmia, valvular abnormality, anemia, hypovolemia  Patient is a 66 year old male presenting after syncopal episode.  Occurred after he was bending down to pick up laundry and stood up quickly had prodrome of lightheadedness dizziness.  He had no postictal.  This seems to have fully been a syncopal episode.  He had no preceding chest pain shortness of breath or other ischemic symptoms.  He is otherwise been well without any GI losses or decreased p.o. intake to suggest hypovolemia.  Patient's vitals are stable.  He does have a bit of swelling to the posterior occiput did hit his head will obtain CT head.  He has no C-spine tenderness.  Patient also endorsing about a week of intermittent left temporal pain.  On exam there is no temporal tenderness he has no pain currently.  Considered temporal arteritis but given pain is coming and going and is not happening right now I think this is less likely.  Discussed with him that he will need to follow-up with his PCP as if this is ongoing may need to have an ESR checked.  No of note he is not having any vision change or other neurologic symptoms.  Check CBC BMP and  CT head if normal and patient ambulates without symptoms can likely be discharged.  CBC and BMP are reassuring.  CT head is negative for acute abnormality.  Patient was able to ambulate.  Overall suspect that this was  orthostatic in nature.  Given reassuring work-up here will discharge.     FINAL CLINICAL IMPRESSION(S) / ED DIAGNOSES   Final diagnoses:  Syncope, unspecified syncope type     Rx / DC Orders   ED Discharge Orders     None        Note:  This document was prepared using Dragon voice recognition software and may include unintentional dictation errors.   Rada Hay, MD 10/18/22 [phone]

## 2022-10-18 NOTE — Discharge Instructions (Addendum)
Please make sure you are standing up slowly and drinking enough water.  You can take Tylenol Motrin for your head pain.  If you continue to have pain of your left temple, please follow-up with your primary care provider.

## 2022-10-20 DIAGNOSIS — R55 Syncope and collapse: Secondary | ICD-10-CM | POA: Insufficient documentation

## 2022-10-23 NOTE — Progress Notes (Unsigned)
Cardiology Office Note  Date:  10/25/2022   ID:  Geoffrey Cruz, Geoffrey Cruz 06/08/1956, MRN [id number]  PCP:  Gracelyn Nurse, MD   Chief Complaint  Patient presents with   [facility] follow up     Patient was at [facility] with synocpe on 10/18/2022. Patient c/o headaches daily. Medications reviewed by the patient verbally.     HPI:  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  He went to pick up his laundry and then stood up quickly.  He then was walking across the room when he felt very lightheaded and dizzy.  He then syncopized.  Did hit his head.  Apparently this only lasted for several seconds and when he woke he felt back to baseline  Blood pressure in the ER was elevated ER work-up negative, lab work reviewed was discharged home  Since the episode as detailed above reports he feels well with no complaints Denies orthostatic symptoms Reports he is hydrated  No regular exercise program Has noted some discoloration of his feet Reports he has neuropathy  Labs reviewed Total chol 172, LDL 93 A1C 5.8 Normal BMP  Echocardiogram, June 14, 2022  normal left and right ventricular function, no significant valvular heart disease  EKG personally reviewed by myself on todays visit NSR rate 61 bpm with RBBB, LAFB  PMH:   has a past medical history of Anxiety, Bipolar disorder (HCC), Constipation, Depression, History of prostatitis, Hyperlipidemia, Hypogonadism in male, Hypothyroidism, Neuropathy, Psoriasis, Schizophrenia (HCC), Sleep apnea, Swelling, and Thyroid disease.  PSH:    Past Surgical History:  Procedure Laterality Date    BACK SURGERY  2012   COLONOSCOPY     COLONOSCOPY WITH PROPOFOL N/A 12/19/2018   Procedure: COLONOSCOPY WITH PROPOFOL;  Surgeon: Toledo, Boykin Nearing, MD;  Location: [facility] ENDOSCOPY;  Service: Gastroenterology;  Laterality: N/A;   KIDNEY SURGERY     KNEE ARTHROSCOPY     POSTERIOR FUSION LUMBAR SPINE     WISDOM TOOTH EXTRACTION      Current Outpatient Medications  Medication Sig Dispense Refill   ARIPiprazole (ABILIFY) 20 MG tablet Take 20 mg by mouth daily.     buPROPion (WELLBUTRIN SR) 200 MG 12 hr tablet Take 200 mg by mouth 2 (two) times daily.     clonazePAM (KLONOPIN) 0.5 MG tablet Take 0.5 mg by mouth 2 (two) times daily.     fluvoxaMINE (LUVOX) 100 MG tablet Take 100 mg by mouth 2 (two) times daily.     furosemide (LASIX) 40 MG tablet Take 40 mg by mouth daily.     gabapentin (NEURONTIN) 400 MG capsule Take 400 mg by mouth 3 (three) times daily.     levothyroxine (SYNTHROID, LEVOTHROID) 88 MCG tablet Take 88 mcg by mouth daily before breakfast.     metFORMIN (GLUCOPHAGE) 500 MG tablet Take 1 tablet (500 mg total) by mouth daily with breakfast. (Patient not taking: Reported on 09/13/2022) 30 tablet 0   naproxen (NAPROSYN) 500 MG tablet Take 500 mg by mouth 2 (two) times daily with a meal. (Patient not taking: Reported on 09/13/2022)     No current facility-administered medications for this visit.    Allergies:  Hydrocodone-acetaminophen, Propoxyphene, and Codeine   Social History:  The patient  reports that he quit smoking about 34 years ago. His smoking use included cigarettes. He has never used smokeless tobacco. He reports that he does not drink alcohol and does not use drugs.   Family History:   family history includes Cancer in his mother; Depression in his mother; Diabetes in his father; Heart disease in his father; Heart failure in his brother and father; Hyperlipidemia in his mother; Hypertension in his father.    Review of Systems: Review of Systems  Constitutional:  Negative.   HENT: Negative.    Respiratory: Negative.    Cardiovascular: Negative.   Gastrointestinal: Negative.   Musculoskeletal: Negative.   Neurological:  Positive for loss of consciousness.  Psychiatric/Behavioral: Negative.    All other systems reviewed and are negative.  PHYSICAL EXAM: VS:  BP 130/62 (BP Location: Left Arm, Patient Position: Sitting, Cuff Size: Large)   Pulse 61   Ht 5\' 11"  (1.803 m)   Wt (!) 301 lb 2 oz (136.6 kg)   SpO2 97%   BMI 42.00 kg/m  , BMI Body mass index is 42 kg/m. Constitutional:  oriented to person, place, and time. No distress.  HENT:  Head: Grossly normal Eyes:  no discharge. No scleral icterus.  Neck: No JVD, no carotid bruits  Cardiovascular: Regular rate and rhythm, no murmurs appreciated Pulmonary/Chest: Clear to auscultation bilaterally, no wheezes or rails Abdominal: Soft.  no distension.  no tenderness.  Musculoskeletal: Normal range of motion Neurological:  normal muscle tone. Coordination normal. No atrophy Skin: Skin warm and dry Psychiatric: normal affect, pleasant  Recent Labs: 10/18/2022: ALT 26; BUN 16; Creatinine, Ser 1.00; Hemoglobin 15.2; Platelets 246; Potassium 3.6; Sodium 139    Lipid Panel Lab Results  Component Value Date   CHOL 170 04/01/2022   HDL 49 04/01/2022   LDLCALC 87 04/01/2022   TRIG 203 (H) 04/01/2022      Wt Readings from Last 3 Encounters:  10/25/22 (!) 301 lb 2 oz (136.6 kg)  10/18/22 298 lb (135.2 kg)  05/14/22 (!) 301 lb 4 oz (136.6 kg)     ASSESSMENT AND PLAN:  Problem List Items Addressed This Visit     Obesity, morbid (HCC)   Apnea, sleep   Other Visit Diagnoses     Syncope and collapse    -  Primary   Abnormal EKG       SOB (shortness of breath)         Abnormal EKG Right bundle branch block noted, left anterior fascicular block Echocardiogram with normal function  Syncope Details concerning for orthostasis Blood pressure stable on today's visit We have ordered a  Zio monitor for further evaluation  Leg edema Maintained on Lasix 40 daily  appears euvolemic  Shortness of breath Normal echocardiogram, recommended walking program   Total encounter time more than 30 minutes  Greater than 50% was spent in counseling and coordination of care with the patient  Signed, 07/14/22, M.D., Ph.D. [facility], [facility] [place], [place]

## 2022-10-25 ENCOUNTER — Ambulatory Visit: Payer: Medicare Other | Attending: Cardiovascular Disease | Admitting: Cardiovascular Disease

## 2022-10-25 ENCOUNTER — Ambulatory Visit (INDEPENDENT_AMBULATORY_CARE_PROVIDER_SITE_OTHER): Payer: Medicare Other

## 2022-10-25 ENCOUNTER — Encounter: Payer: Self-pay | Admitting: Cardiovascular Disease

## 2022-10-25 VITALS — BP 130/62 | HR 61 | Ht 71.0 in | Wt 301.1 lb

## 2022-10-25 DIAGNOSIS — G4733 Obstructive sleep apnea (adult) (pediatric): Secondary | ICD-10-CM | POA: Diagnosis not present

## 2022-10-25 DIAGNOSIS — R0602 Shortness of breath: Secondary | ICD-10-CM | POA: Diagnosis not present

## 2022-10-25 DIAGNOSIS — R55 Syncope and collapse: Secondary | ICD-10-CM

## 2022-10-25 DIAGNOSIS — R9431 Abnormal electrocardiogram [ECG] [EKG]: Secondary | ICD-10-CM

## 2022-10-25 NOTE — Patient Instructions (Addendum)
Medication Instructions:  No changes  If you need a refill on your cardiac medications before your next appointment, please call your pharmacy.    Lab work: No new labs needed   Testing/Procedures: - Heart Monitor:  Length of Wear: 14 days  Your monitor will be mailed to your home address within 3-5 business days. However, if you have not received your monitor after 5 business days please send [place] a MyChart message or call the office at [phone], so we may follow up on this for you.   Your physician has recommended that you wear a Zio XT (heart) monitor.   This monitor is a medical device that records the heart's electrical activity. Doctors most often use these monitors to diagnose arrhythmias. Arrhythmias are problems with the speed or rhythm of the heartbeat. The monitor is a small device applied to your chest. You can wear one while you do your normal daily activities. While wearing this monitor if you have any symptoms to push the button and record what you felt. Once you have worn this monitor for the period of time provider prescribed (Usually 14 days), you will return the monitor device in the postage paid box. Once it is returned they will download the data collected and provide [place] with a report which the provider will then review and we will call you with those results. Important tips:  Avoid showering during the first 24 hours of wearing the monitor. Avoid excessive sweating to help maximize wear time. Do not submerge the device, no hot tubs, and no swimming pools. Keep any lotions or oils away from the patch. After 24 hours you may shower with the patch on. Take brief showers with your back facing the shower head.  Do not remove patch once it has been placed because that will interrupt data and decrease adhesive wear time. Push the button when you have any symptoms and write down what you were feeling. Once you have completed wearing your monitor, remove and place into  box which has postage paid and place in your outgoing mailbox.  If for some reason you have misplaced your box then call our office and we can provide another box and/or mail it off for you.       Follow-Up: At [facility], you and your health needs are our priority.  As part of our continuing mission to provide you with exceptional heart care, we have created designated Provider Care Teams.  These Care Teams include your primary Cardiologist (physician) and Advanced Practice Providers (APPs -  Physician Assistants and Nurse Practitioners) who all work together to provide you with the care you need, when you need it.  You will need a follow up appointment in 12 months  Providers on your designated Care Team:   Nicolasa Ducking, NP Eula Listen, PA-C Cadence Fransico Michael, [place]  COVID-19 Vaccine Information can be found at: [email or web address] For questions related to vaccine distribution or appointments, please email [email or web address] or call [phone].

## 2022-10-28 DIAGNOSIS — R55 Syncope and collapse: Secondary | ICD-10-CM | POA: Diagnosis not present

## 2022-11-23 ENCOUNTER — Telehealth: Payer: Self-pay | Admitting: Cardiovascular Disease

## 2022-11-23 NOTE — Telephone Encounter (Signed)
Attempted to call the patient. No answer- I left a message to please call back.  

## 2022-11-23 NOTE — Telephone Encounter (Signed)
The patient has been notified of the result and verbalized understanding.  All questions (if any) were answered. Sherri Rad, RN 11/23/2022 11:17 AM

## 2022-11-23 NOTE — Telephone Encounter (Signed)
Patient was returning call. Please advise ?

## 2022-11-23 NOTE — Telephone Encounter (Signed)
Patient calling for monitor results. 

## 2022-11-23 NOTE — Telephone Encounter (Signed)
Geoffrey Iba, MD 11/19/2022  4:54 PM EST     Event monitor Normal sinus rhythm, Short pauses noted 3 seconds, not triggered PACs noted, 3% burden Rare episodes of tachycardia longest 14 seconds No additional workup needed at this time

## 2022-12-16 ENCOUNTER — Ambulatory Visit: Payer: Medicare Other | Admitting: Podiatry

## 2023-03-14 ENCOUNTER — Ambulatory Visit: Payer: Medicare Other | Admitting: Podiatry

## 2023-05-20 ENCOUNTER — Ambulatory Visit (INDEPENDENT_AMBULATORY_CARE_PROVIDER_SITE_OTHER): Payer: 59 | Admitting: Podiatry

## 2023-05-20 VITALS — BP 144/82

## 2023-05-20 DIAGNOSIS — L84 Corns and callosities: Secondary | ICD-10-CM

## 2023-05-20 DIAGNOSIS — B351 Tinea unguium: Secondary | ICD-10-CM | POA: Diagnosis not present

## 2023-05-20 DIAGNOSIS — M79676 Pain in unspecified toe(s): Secondary | ICD-10-CM

## 2023-05-20 DIAGNOSIS — G629 Polyneuropathy, unspecified: Secondary | ICD-10-CM | POA: Diagnosis not present

## 2023-05-23 ENCOUNTER — Encounter: Payer: Self-pay | Admitting: Podiatry

## 2023-05-23 NOTE — Progress Notes (Signed)
  Subjective:  Patient ID: Geoffrey Cruz, male    DOB: March 23, 1956,  MRN: [id number]  Geoffrey Cruz presents to clinic today for at risk foot care with history of peripheral neuropathy and corn(s) left foot, callus(es) right foot and painful mycotic nails.  Pain interferes with ambulation. Aggravating factors include wearing enclosed shoe gear. Painful toenails interfere with ambulation. Aggravating factors include wearing enclosed shoe gear. Pain is relieved with periodic professional debridement. Painful corns and calluses are aggravated when weightbearing with and without shoegear. Pain is relieved with periodic professional debridement. Chief Complaint  Patient presents with   Nail Problem    RFC,Referring Provider Gracelyn Nurse, MD,LOV:05/24      New problem(s): None.   PCP is Gracelyn Nurse, MD.  Allergies  Allergen Reactions   Hydrocodone-Acetaminophen Swelling    Swelling in ankles   Propoxyphene Nausea Only   Codeine Other (See Comments) and Anxiety    NERVOUS FEELING NERVOUS FEELING    Review of Systems: Negative except as noted in the HPI. Objective:   Vitals:   05/20/23 0928  BP: (!) 144/82   Constitutional Geoffrey Cruz is a pleasant 67 y.o. male, morbidly obese in NAD. AAO x 3.   Vascular Vascular Examination: Capillary refill time immediate b/l. Vascular status intact b/l with palpable pedal pulses. Pedal hair sparse. No edema. No pain with calf compression b/l. Skin temperature gradient WNL LLE; warm to cool RLE. No cyanosis or clubbing b/l. No ischemia or gangrene noted b/l LE.  Neurological Examination: Protective sensation diminished with 10g monofilament b/l.  Dermatological Examination: Pedal skin with normal turgor, texture and tone b/l.  No open wounds. No interdigital macerations.   Toenails 1-5 b/l thick, discolored, elongated with subungual debris and pain on dorsal palpation.   Hyperkeratotic lesion(s) L 3rd toe and right great toe.  No  erythema, no edema, no drainage, no fluctuance.  Musculoskeletal Examination: Normal muscle strength 5/5 to all lower extremity muscle groups bilaterally. Hammertoe(s) noted to the L 3rd toe. No pain, crepitus or joint limitation noted with ROM b/l LE.  Patient ambulates independently without assistive aids.  Radiographs: None  Last A1c:       No data to display             Assessment:   1. Pain due to onychomycosis of toenail   2. Corns and callosities   3. Peripheral polyneuropathy    Plan:  Patient was evaluated and treated and all questions answered. Consent given for treatment as described below: -Patient was evaluated and treated. All patient's and/or POA's questions/concerns answered on today's visit. -Toenails 1-5 b/l were debrided in length and girth with sterile nail nippers and dremel without iatrogenic bleeding.  -Corn(s) L 3rd toe pared utilizing sterile scalpel blade without complication or incident. Total number debrided=1. -Callus(es) right great toe pared utilizing sterile scalpel blade without complication or incident. Total number debrided =1. -Patient/POA to call should there be question/concern in the interim.  Return in about 3 months (around 08/20/2023).  Freddie Breech, DPM

## 2023-08-29 ENCOUNTER — Ambulatory Visit (INDEPENDENT_AMBULATORY_CARE_PROVIDER_SITE_OTHER): Payer: 59

## 2023-08-29 ENCOUNTER — Ambulatory Visit (INDEPENDENT_AMBULATORY_CARE_PROVIDER_SITE_OTHER): Payer: 59 | Admitting: Podiatry

## 2023-08-29 ENCOUNTER — Encounter: Payer: Self-pay | Admitting: Podiatry

## 2023-08-29 VITALS — BP 160/83 | HR 65

## 2023-08-29 DIAGNOSIS — B351 Tinea unguium: Secondary | ICD-10-CM | POA: Diagnosis not present

## 2023-08-29 DIAGNOSIS — M79676 Pain in unspecified toe(s): Secondary | ICD-10-CM | POA: Diagnosis not present

## 2023-08-29 DIAGNOSIS — L84 Corns and callosities: Secondary | ICD-10-CM

## 2023-08-29 DIAGNOSIS — G629 Polyneuropathy, unspecified: Secondary | ICD-10-CM

## 2023-08-29 DIAGNOSIS — W458XXA Other foreign body or object entering through skin, initial encounter: Secondary | ICD-10-CM

## 2023-08-29 NOTE — Progress Notes (Unsigned)
Subjective:  Patient ID: Geoffrey Cruz, male    DOB: Apr 27, 1956,  MRN: [id number]  Geoffrey Cruz presents to clinic today for {jgcomplaint:23593}  Chief Complaint  Patient presents with   Diabetes   New problem(s): {jgcomplaint:23593}  PCP is Gracelyn Nurse, MD.  Allergies  Allergen Reactions   Hydrocodone-Acetaminophen Swelling    Swelling in ankles   Propoxyphene Nausea Only   Codeine Other (See Comments) and Anxiety    NERVOUS FEELING NERVOUS FEELING    Review of Systems: Negative except as noted in the HPI.  Objective: No changes noted in today's physical examination. Vitals:   08/29/23 0956  BP: (!) 160/83  Pulse: 65   Geoffrey Cruz is a pleasant 67 y.o. male {jgbodyhabitus:24098} AAO x 3.   Assessment/Plan: 1. Pain due to onychomycosis of toenail   2. Other foreign body or object entering through skin, initial encounter   3. Heel callus   4. Neuropathy    {Jgplan:23602::"-Patient/POA to call should there be question/concern in the interim."}   Return in about 3 months (around 11/28/2023).  Geoffrey Cruz, DPM

## 2023-09-12 ENCOUNTER — Ambulatory Visit: Payer: 59 | Admitting: Podiatry

## 2023-10-05 ENCOUNTER — Ambulatory Visit: Payer: 59 | Admitting: Podiatry

## 2023-10-10 ENCOUNTER — Encounter: Payer: Self-pay | Admitting: Podiatry

## 2023-10-10 ENCOUNTER — Ambulatory Visit (INDEPENDENT_AMBULATORY_CARE_PROVIDER_SITE_OTHER): Payer: 59 | Admitting: Podiatry

## 2023-10-10 DIAGNOSIS — D2372 Other benign neoplasm of skin of left lower limb, including hip: Secondary | ICD-10-CM

## 2023-10-10 DIAGNOSIS — M722 Plantar fascial fibromatosis: Secondary | ICD-10-CM

## 2023-10-10 MED ORDER — TRIAMCINOLONE ACETONIDE 40 MG/ML IJ SUSP
20.0000 mg | Freq: Once | INTRAMUSCULAR | Status: AC
  Administered 2023-10-10: 20 mg

## 2023-10-10 NOTE — Progress Notes (Signed)
He presents today chief complaint of painful heel and callus left foot.  States that he was referred here by Dr. Donzetta Matters who does his routine footcare she took x-rays that I had a lot of arthritis.  Objective: Vital signs stable alert oriented x 3 there is no erythema edema cellulitis drainage or odor reactive hyper keratoma plantar aspect of the left heel.  Once debrided still has pain on palpation deep.  Assessment: Pain in limb secondary to Planter fasciitis and benign skin lesions.  Plan: I injected the plantar fascia today 20 mg Kenalog 5 mg Marcaine.  Also debrided the benign skin lesion.  Discussed appropriate shoe gear

## 2023-12-09 ENCOUNTER — Ambulatory Visit (INDEPENDENT_AMBULATORY_CARE_PROVIDER_SITE_OTHER): Payer: 59 | Admitting: Podiatry

## 2023-12-09 ENCOUNTER — Encounter: Payer: Self-pay | Admitting: Podiatry

## 2023-12-09 DIAGNOSIS — D2372 Other benign neoplasm of skin of left lower limb, including hip: Secondary | ICD-10-CM

## 2023-12-09 DIAGNOSIS — M7732 Calcaneal spur, left foot: Secondary | ICD-10-CM | POA: Diagnosis not present

## 2023-12-09 DIAGNOSIS — M722 Plantar fascial fibromatosis: Secondary | ICD-10-CM | POA: Diagnosis not present

## 2023-12-09 MED ORDER — BETAMETHASONE SOD PHOS & ACET 6 (3-3) MG/ML IJ SUSP: 3.0000 mg | Freq: Once | INTRAMUSCULAR | Status: AC

## 2023-12-09 NOTE — Progress Notes (Signed)
   Chief Complaint  Patient presents with   Callouses    I have a hard rock place on the bottom of my left heel.  I can barely walk.   Foot Pain    I have pain on the side of my foot and it still hurts when I flex my foot backwards.    Subjective: 68 y.o. male presenting today for evaluation of left heel pain.  Patient states that it is exacerbated when he goes barefoot.  He has a history of plantar fasciitis to the left heel.  He is in the past have helped significantly.  Presenting for further treatment evaluation   Past Medical History:  Diagnosis Date   Anxiety    Bipolar disorder (HCC)    Constipation    Depression    History of prostatitis    Hyperlipidemia    Hypogonadism in male    Hypothyroidism    Neuropathy    Psoriasis    Schizophrenia (HCC)    Sleep apnea    Swelling    Thyroid disease      Objective: Physical Exam General: The patient is alert and oriented x3 in no acute distress.  Dermatology: Skin is warm, dry and supple bilateral lower extremities. Negative for open lesions or macerations bilateral.  Hyperkeratotic lesion to the plantar aspect of the left heel with a central nucleated core also noted  Vascular: Dorsalis Pedis and Posterior Tibial pulses palpable bilateral.  Capillary fill time is immediate to all digits.  Neurological: Light touch and protective threshold intact bilateral.   Musculoskeletal: Tenderness to palpation to the plantar aspect of the left heel along the plantar fascia. All other joints range of motion within normal limits bilateral. Strength 5/5 in all groups bilateral.   Assessment: 1. Plantar fasciitis left foot 2.  Symptomatic plantar heel spur left 3.  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   -Injection of 0.5cc Celestone  soluspan injected into the left plantar fascia.  -Excisional debridement of the hyperkeratotic eccrine poroma was performed today using a  312 scalpel without incident or bleeding.  Salicylic acid applied.  Patient felt significant relief -Advised against going barefoot.  Continue wearing diabetic shoes and insoles -Return to clinic as needed   Thresa EMERSON Sar, DPM [facility]  Dr. Thresa EMERSON Sar, DPM    [address] [place], [place] [address]                Office [phone]  Fax [phone]

## 2023-12-12 ENCOUNTER — Ambulatory Visit (INDEPENDENT_AMBULATORY_CARE_PROVIDER_SITE_OTHER): Payer: 59 | Admitting: Podiatry

## 2023-12-12 DIAGNOSIS — B351 Tinea unguium: Secondary | ICD-10-CM

## 2023-12-12 DIAGNOSIS — G629 Polyneuropathy, unspecified: Secondary | ICD-10-CM | POA: Diagnosis not present

## 2023-12-12 DIAGNOSIS — L84 Corns and callosities: Secondary | ICD-10-CM

## 2023-12-12 DIAGNOSIS — M79676 Pain in unspecified toe(s): Secondary | ICD-10-CM

## 2023-12-15 ENCOUNTER — Encounter: Payer: Self-pay | Admitting: Podiatry

## 2023-12-15 NOTE — Progress Notes (Signed)
  Subjective:  Patient ID: Geoffrey Cruz, male    DOB: 1956-01-12,  MRN: [id number]  68 y.o. male presents to clinic with  at risk foot care with history of peripheral neuropathy and corn(s) of both feet and painful thick toenails that are difficult to trim. Painful toenails interfere with ambulation. Aggravating factors include wearing enclosed shoe gear. Pain is relieved with periodic professional debridement. Painful corns are aggravated when weightbearing when wearing enclosed shoe gear. Pain is relieved with periodic professional debridement. Has h/o eccrine poroma left heel which was excised by Dr. Janit on last visit. States his left heel feels better now.   New problem(s): None   PCP is Rudolpho Norleen BIRCH, MD. ARNETTA 11/15/2023.  Allergies  Allergen Reactions   Hydrocodone-Acetaminophen Swelling    Swelling in ankles   Propoxyphene Nausea Only   Codeine Other (See Comments) and Anxiety    NERVOUS FEELING NERVOUS FEELING    Review of Systems: Negative except as noted in the HPI.   Objective:  Geoffrey Cruz is a pleasant 68 y.o. male morbidly obese in NAD. AAO x 3.  Vascular Examination: Vascular status intact b/l with palpable pedal pulses. CFT immediate b/l. No edema. No pain with calf compression b/l. Skin temperature gradient WNL LLE and warm to cool RLE. No cyanosis or clubbing noted b/l LE.  Neurological Examination: Protective sensation diminished with 10g monofilament b/l.  Dermatological Examination: Pedal skin with normal turgor, texture and tone b/l. Toenails 2-5 b/l thick, discolored, elongated with subungual debris and pain on dorsal palpation. No hyperkeratotic lesions noted b/l. Hyperkeratotic lesion(s) L 3rd toe and right great toe.  No erythema, no edema, no drainage, no fluctuance. Left heel asymptomatic today.  Musculoskeletal Examination: Muscle strength 5/5 to b/l LE. Hammertoe(s) noted to the left third digit.  Radiographs: None  Last A1c:       No  data to display           Assessment:   1. Pain due to onychomycosis of toenail   2. Corns   3. Peripheral polyneuropathy    Plan:  -Patient was evaluated today. All questions/concerns addressed on today's visit. -Left heel addressed by Dr. Janit with excision on last visit. Patient asymptomatic today. -Continue supportive shoe gear daily. -Mycotic toenails 2-5 bilaterally were debrided in length and girth with sterile nail nippers and dremel without iatrogenic bleeding. -Corn(s) L 3rd toe and right great toe pared utilizing sterile scalpel blade without complication or incident. Total number debrided=2. -Patient/POA to call should there be question/concern in the interim.  Return in about 3 months (around 03/11/2024).  Delon LITTIE Merlin, DPM      [place] LOCATION: [address], [place] [address]                   Office [phone]   [facility] LOCATION: [address] [place], [place] [address] Office [phone]

## 2023-12-21 ENCOUNTER — Ambulatory Visit: Payer: 59 | Admitting: Podiatry

## 2024-03-12 ENCOUNTER — Encounter: Payer: Self-pay | Admitting: Podiatry

## 2024-03-12 ENCOUNTER — Ambulatory Visit (INDEPENDENT_AMBULATORY_CARE_PROVIDER_SITE_OTHER): Payer: 59 | Admitting: Podiatry

## 2024-03-12 DIAGNOSIS — M79676 Pain in unspecified toe(s): Secondary | ICD-10-CM | POA: Diagnosis not present

## 2024-03-12 DIAGNOSIS — B351 Tinea unguium: Secondary | ICD-10-CM | POA: Diagnosis not present

## 2024-03-12 DIAGNOSIS — Q828 Other specified congenital malformations of skin: Secondary | ICD-10-CM

## 2024-03-12 DIAGNOSIS — G629 Polyneuropathy, unspecified: Secondary | ICD-10-CM

## 2024-03-12 DIAGNOSIS — L84 Corns and callosities: Secondary | ICD-10-CM

## 2024-03-12 NOTE — Progress Notes (Unsigned)
  Subjective:  Patient ID: Geoffrey Cruz, male    DOB: 1956-01-27,  MRN: [id number]  Geoffrey Cruz presents to clinic today for {jgcomplaint:23593}  Chief Complaint  Patient presents with   Nail Problem    "Cut my toenails."   Callouses    "Dr. Logan Bores said I have a callus on my left heel.  It's killing me."   New problem(s): None. {jgcomplaint:23593}  PCP is Gracelyn Nurse, MD.  Allergies  Allergen Reactions   Hydrocodone-Acetaminophen Swelling    Swelling in ankles   Propoxyphene Nausea Only   Codeine Other (See Comments) and Anxiety    NERVOUS FEELING NERVOUS FEELING    Review of Systems: Negative except as noted in the HPI.  Objective: No changes noted in today's physical examination. There were no vitals filed for this visit. Geoffrey Cruz is a pleasant 68 y.o. male {jgbodyhabitus:24098} AAO x 3.  Vascular Examination: Vascular status intact b/l with palpable pedal pulses. CFT immediate b/l. No edema. No pain with calf compression b/l. Skin temperature gradient WNL LLE and warm to cool RLE. No cyanosis or clubbing noted b/l LE.  Neurological Examination: Protective sensation diminished with 10g monofilament b/l.  Dermatological Examination: Pedal skin with normal turgor, texture and tone b/l. Toenails 2-5 b/l thick, discolored, elongated with subungual debris and pain on dorsal palpation. No hyperkeratotic lesions noted b/l. Hyperkeratotic lesion(s) L 3rd toe and right great toe.  No erythema, no edema, no drainage, no fluctuance. Left heel asymptomatic today.  Musculoskeletal Examination: Muscle strength 5/5 to b/l LE. Hammertoe(s) noted to the left third digit.  Radiographs: None Assessment/Plan: 1. Pain due to onychomycosis of toenail   2. Pre-ulcerative calluses   3. Corns   4. Peripheral polyneuropathy     No orders of the defined types were placed in this encounter.   None {Jgplan:23602::"-Patient/POA to call should there be question/concern in the  interim."}   Return in about 3 months (around 06/11/2024).  Freddie Breech, DPM       LOCATION: [address] [place] [address]                   Office [phone]   [facility] LOCATION: [address] [place], [place] [address] Office [phone]

## 2024-04-26 ENCOUNTER — Encounter: Payer: Self-pay | Admitting: Internal Medicine

## 2024-05-23 ENCOUNTER — Encounter
Admission: RE | Disposition: A | Discharge status: Home / Self Care | Payer: Self-pay | Source: Home / Self Care | Attending: Internal Medicine

## 2024-05-23 ENCOUNTER — Encounter: Payer: Self-pay | Admitting: Internal Medicine

## 2024-05-23 ENCOUNTER — Ambulatory Visit: Admitting: Anesthesiology

## 2024-05-23 ENCOUNTER — Ambulatory Visit
Admission: RE | Admit: 2024-05-23 | Discharge: 2024-05-23 | Disposition: A | Discharge status: Home / Self Care | Attending: Internal Medicine | Admitting: Internal Medicine

## 2024-05-23 ENCOUNTER — Other Ambulatory Visit: Payer: Self-pay

## 2024-05-23 DIAGNOSIS — Z1211 Encounter for screening for malignant neoplasm of colon: Secondary | ICD-10-CM | POA: Diagnosis present

## 2024-05-23 DIAGNOSIS — K640 First degree hemorrhoids: Secondary | ICD-10-CM | POA: Insufficient documentation

## 2024-05-23 DIAGNOSIS — I739 Peripheral vascular disease, unspecified: Secondary | ICD-10-CM | POA: Insufficient documentation

## 2024-05-23 DIAGNOSIS — E039 Hypothyroidism, unspecified: Secondary | ICD-10-CM | POA: Diagnosis not present

## 2024-05-23 DIAGNOSIS — F319 Bipolar disorder, unspecified: Secondary | ICD-10-CM | POA: Insufficient documentation

## 2024-05-23 DIAGNOSIS — K5730 Diverticulosis of large intestine without perforation or abscess without bleeding: Secondary | ICD-10-CM | POA: Insufficient documentation

## 2024-05-23 DIAGNOSIS — F419 Anxiety disorder, unspecified: Secondary | ICD-10-CM | POA: Diagnosis not present

## 2024-05-23 DIAGNOSIS — Z800 Family history of malignant neoplasm of digestive organs: Secondary | ICD-10-CM | POA: Diagnosis not present

## 2024-05-23 DIAGNOSIS — G4733 Obstructive sleep apnea (adult) (pediatric): Secondary | ICD-10-CM | POA: Diagnosis not present

## 2024-05-23 HISTORY — DX: Prediabetes: R73.03

## 2024-05-23 HISTORY — DX: Ventral hernia without obstruction or gangrene: K43.9

## 2024-05-23 HISTORY — DX: Peripheral vascular disease, unspecified: I73.9

## 2024-05-23 SURGERY — COLONOSCOPY
Anesthesia: General

## 2024-05-23 MED ORDER — PROPOFOL 10 MG/ML IV BOLUS
INTRAVENOUS | Status: DC | PRN
  Administered 2024-05-23 (×2): 50 mg via INTRAVENOUS
  Administered 2024-05-23: 150 mg via INTRAVENOUS
  Administered 2024-05-23 (×2): 50 mg via INTRAVENOUS

## 2024-05-23 MED ORDER — SODIUM CHLORIDE 0.9 % IV SOLN: INTRAVENOUS | Status: DC | PRN

## 2024-05-23 MED ORDER — SODIUM CHLORIDE 0.9 % IV SOLN
INTRAVENOUS | Status: DC
  Administered 2024-05-23: 500 mL via INTRAVENOUS

## 2024-05-23 NOTE — Anesthesia Postprocedure Evaluation (Signed)
 Anesthesia Post Note  Patient: Geoffrey Cruz  Procedure(s) Performed: COLONOSCOPY  Patient location during evaluation: Endoscopy Anesthesia Type: General Level of consciousness: awake and alert Pain management: pain level controlled Vital Signs Assessment: post-procedure vital signs reviewed and stable Respiratory status: spontaneous breathing, nonlabored ventilation, respiratory function stable and patient connected to nasal cannula oxygen Cardiovascular status: blood pressure returned to baseline and stable Postop Assessment: no apparent nausea or vomiting Anesthetic complications: no  No notable events documented.   Last Vitals:  Vitals:   05/23/24 1317 05/23/24 1328  BP: (!) 107/41 130/62  Pulse: 65   Resp: 16   Temp:    SpO2: 97%     Last Pain:  Vitals:   05/23/24 1328  TempSrc:   PainSc: 0-No pain                 Enrique Harvest

## 2024-05-23 NOTE — Transfer of Care (Signed)
 Immediate Anesthesia Transfer of Care Note  Patient: Geoffrey Cruz  Procedure(s) Performed: COLONOSCOPY  Patient Location: PACU and Endoscopy Unit  Anesthesia Type:MAC  Level of Consciousness: drowsy  Airway & Oxygen Therapy: Patient Spontanous Breathing  Post-op Assessment: Report given to RN and Post -op Vital signs reviewed and stable  Post vital signs: Reviewed and stable  Last Vitals:  Vitals Value Taken Time  BP    Temp    Pulse    Resp 16 05/23/24 13:08  SpO2    Vitals shown include unfiled device data.  Last Pain:  Vitals:   05/23/24 1133  TempSrc: Oral  PainSc: 0-No pain      Patients Stated Pain Goal: 7 (05/23/24 1133)  Complications: No notable events documented.

## 2024-05-23 NOTE — H&P (Signed)
 Outpatient short stay form Pre-procedure 05/23/2024 12:35 PM Geoffrey Cruz, M.D.  Primary Physician: Geoffrey Cruz, M.D.  Reason for visit:  Family history of colon cancer  History of present illness:  Geoffrey Cruz presents to the [facility] clinic to discuss repeat colonoscopy as well as change in bowel habits. He received colon form in the mail telling him it was time to have colonoscopy. Last colonoscopy performed Jan 2020 by Dr. Corky Cruz showed mild pandiverticulosis and otherwise normal examined colon. Colonoscopy in 2014 also was negative for polyps. Initial screening colonoscopy in 2011 removed one subcentimeter TA. He has family history of colorectal cancer in mother and brother both diagnosed when they were 110. He has noticed a change in his bowel habits where he has been alternating between constipation and diarrhea. He has been leaning more towards diarrhea over the past several months. He can have bad days where he goes 1-3 times daily with stool consistency similar to #5-7 on Bristol scale. Sometimes he can skip days in between bowel movements. Sometimes stool are very sticky looking and look like soft serve ice cream. He denies any issues with hematochezia, melena, fecal urgency, or fecal incontinence. Appetite and diet are stable. He has been struggling with weight loss. His insurance wouldn't cover GLP-1 injections. He is not currently counting calories or being strict with his diet. He denies any complaints of abdominal pain or abdominal cramping. He denies any UGI symptoms such as nausea, vomiting, esophageal dysphagia, odynophagia, early satiety, hoarseness, or epigastric abdominal pain. He wears CPAP nightly for OSA. He has hx of schizophrenia which is currently well-controlled. No other questions or concerns at this time.      Current Facility-Administered Medications:    0.9 %  sodium chloride  infusion, , Intravenous, Continuous, Geoffrey Nevels K, MD, Last Rate: 20 mL/hr at  05/23/24 1144, 500 mL at 05/23/24 1144  Facility-Administered Medications Prior to Admission  Medication Dose Route Frequency Provider Last Rate Last Admin   betamethasone  acetate-betamethasone  sodium phosphate (CELESTONE ) injection 3 mg  3 mg Intra-articular Once        Medications Prior to Admission  Medication Sig Dispense Refill Last Dose/Taking   ARIPiprazole (ABILIFY) 20 MG tablet Take 20 mg by mouth daily.   05/22/2024   buPROPion (WELLBUTRIN SR) 200 MG 12 hr tablet Take 200 mg by mouth 2 (two) times daily.   05/22/2024   clonazePAM (KLONOPIN) 0.5 MG tablet Take 0.5 mg by mouth 2 (two) times daily.   05/23/2024 at  8:30 AM   fluvoxaMINE (LUVOX) 100 MG tablet Take 100 mg by mouth 2 (two) times daily.   05/22/2024   furosemide (LASIX) 40 MG tablet Take 40 mg by mouth daily.   05/22/2024   gabapentin  (NEURONTIN ) 400 MG capsule Take 400 mg by mouth 3 (three) times daily.   05/22/2024   levothyroxine (SYNTHROID, LEVOTHROID) 88 MCG tablet Take 88 mcg by mouth daily before breakfast.   05/23/2024   metFORMIN  (GLUCOPHAGE ) 500 MG tablet Take 1 tablet (500 mg total) by mouth daily with breakfast. (Patient not taking: Reported on 05/20/2023) 30 tablet 0    naproxen (NAPROSYN) 500 MG tablet Take 500 mg by mouth 2 (two) times daily with a meal. (Patient not taking: Reported on 05/20/2023)        Allergies  Allergen Reactions   Hydrocodone-Acetaminophen Swelling    Swelling in ankles   Propoxyphene Nausea Only   Codeine Other (See Comments) and Anxiety    NERVOUS FEELING NERVOUS FEELING  Past Medical History:  Diagnosis Date   Anxiety    Bipolar disorder (HCC)    Constipation    Depression    Hernia of abdominal wall    History of prostatitis    Hyperlipidemia    Hypogonadism in male    Hypothyroidism    Neuropathy    Peripheral vascular disease, unspecified (HCC)    Pre-diabetes    Psoriasis    Schizophrenia (HCC)    Sleep apnea    Swelling    Thyroid disease     Review of  systems:  Otherwise negative.    Physical Exam  Gen: Alert, oriented. Appears stated age.  HEENT: Reform/AT. PERRLA. Lungs: CTA, no wheezes. CV: RR nl S1, S2. Abd: soft, benign, no masses. BS+ Ext: No edema. Pulses 2+    Planned procedures: Proceed with colonoscopy. The patient understands the nature of the planned procedure, indications, risks, alternatives and potential complications including but not limited to bleeding, infection, perforation, damage to internal organs and possible oversedation/side effects from anesthesia. The patient agrees and gives consent to proceed.  Please refer to procedure notes for findings, recommendations and patient disposition/instructions.     Geoffrey Cruz, M.D. Gastroenterology 05/23/2024  12:35 PM

## 2024-05-23 NOTE — Interval H&P Note (Signed)
 History and Physical Interval Note:  05/23/2024 12:37 PM  Geoffrey Cruz  has presented today for surgery, with the diagnosis of Family history of colon cancer (Z80.0).  The various methods of treatment have been discussed with the patient and family. After consideration of risks, benefits and other options for treatment, the patient has consented to  Procedure(s): COLONOSCOPY (N/A) as a surgical intervention.  The patient's history has been reviewed, patient examined, no change in status, stable for surgery.  I have reviewed the patient's chart and labs.  Questions were answered to the patient's satisfaction.     [place], Maryela Tapper

## 2024-05-23 NOTE — Op Note (Signed)
 [facility] Gastroenterology Patient Name: Geoffrey Cruz Procedure Date: 05/23/2024 11:46 AM MRN: [id number] Account #: [id number] Date of Birth: 22-May-1956 Admit Type: Outpatient Age: 68 Room: [facility] ENDO ROOM 3 Gender: Male Note Status: Finalized Instrument Name: Hyman Main [id number] Procedure:             Colonoscopy Indications:           Screening in patient at increased risk: Family history                         of 1st-degree relative with colorectal cancer Providers:             Nakenya Theall K. Corky Diener MD, MD Referring MD:          Little Riff, MD (Referring MD) Medicines:             Propofol  per Anesthesia Complications:         No immediate complications. Estimated blood loss: None. Procedure:             Pre-Anesthesia Assessment:                        - The risks and benefits of the procedure and the                         sedation options and risks were discussed with the                         patient. All questions were answered and informed                         consent was obtained.                        - Patient identification and proposed procedure were                         verified prior to the procedure by the nurse. The                         procedure was verified in the procedure room.                        - ASA Grade Assessment: III - A patient with severe                         systemic disease.                        - After reviewing the risks and benefits, the patient                         was deemed in satisfactory condition to undergo the                         procedure.                        After obtaining informed consent, the colonoscope was  passed under direct vision. Throughout the procedure,                         the patient's blood pressure, pulse, and oxygen                         saturations were monitored continuously. The                         Colonoscope was introduced  through the anus and                         advanced to the the cecum, identified by appendiceal                         orifice and ileocecal valve. The colonoscopy was                         performed without difficulty. The patient tolerated                         the procedure well. The quality of the bowel                         preparation was adequate. The ileocecal valve,                         appendiceal orifice, and rectum were photographed. Findings:      The perianal and digital rectal examinations were normal. Pertinent       negatives include normal sphincter tone and no palpable rectal lesions.      Non-bleeding internal hemorrhoids were found during retroflexion. The       hemorrhoids were Grade I (internal hemorrhoids that do not prolapse).      Multiple large-mouthed and small-mouthed diverticula were found in the       entire colon. There was no evidence of diverticular bleeding.      The exam was otherwise without abnormality. Impression:            - Non-bleeding internal hemorrhoids.                        - Moderate diverticulosis in the entire examined                         colon. There was no evidence of diverticular bleeding.                        - The examination was otherwise normal.                        - No specimens collected. Recommendation:        - Patient has a contact number available for                         emergencies. The signs and symptoms of potential                         delayed complications were discussed with the patient.  Return to normal activities tomorrow. Written                         discharge instructions were provided to the patient.                        - Resume previous diet.                        - Continue present medications.                        - Repeat colonoscopy in 5 years for screening purposes.                        - Return to GI office PRN.                        - The  findings and recommendations were discussed with                         the patient. Procedure Code(s):     --- Professional ---                        W2956, Colorectal cancer screening; colonoscopy on                         individual at high risk Diagnosis Code(s):     --- Professional ---                        K57.30, Diverticulosis of large intestine without                         perforation or abscess without bleeding                        K64.0, First degree hemorrhoids                        Z80.0, Family history of malignant neoplasm of                         digestive organs CPT copyright 2022 American Medical Association. All rights reserved. The codes documented in this report are preliminary and upon coder review may  be revised to meet current compliance requirements. Cassie Click MD, MD 05/23/2024 1:06:37 PM This report has been signed electronically. Number of Addenda: 0 Note Initiated On: 05/23/2024 11:46 AM Scope Withdrawal Time: 0 hours 6 minutes 19 seconds  Total Procedure Duration: 0 hours 14 minutes 12 seconds  Estimated Blood Loss:  Estimated blood loss: none.      [facility]

## 2024-05-23 NOTE — Anesthesia Preprocedure Evaluation (Addendum)
 Anesthesia Evaluation  Patient identified by MRN, date of birth, ID band Patient awake    Reviewed: Allergy & Precautions, NPO status , Patient's Chart, lab work & pertinent test results  History of Anesthesia Complications Negative for: history of anesthetic complications  Airway Mallampati: III  TM Distance: >3 FB Neck ROM: full    Dental  (+) Partial Lower, Partial Upper   Pulmonary sleep apnea and Continuous Positive Airway Pressure Ventilation , former smoker   Pulmonary exam normal        Cardiovascular + Peripheral Vascular Disease  Normal cardiovascular exam     Neuro/Psych  PSYCHIATRIC DISORDERS Anxiety Depression Bipolar Disorder Schizophrenia   Neuromuscular disease    GI/Hepatic negative GI ROS, Neg liver ROS,,,  Endo/Other  Hypothyroidism    Renal/GU negative Renal ROS  negative genitourinary   Musculoskeletal   Abdominal   Peds  Hematology negative hematology ROS (+)   Anesthesia Other Findings Past Medical History: No date: Anxiety No date: Bipolar disorder (HCC) No date: Constipation No date: Depression No date: Hernia of abdominal wall No date: History of prostatitis No date: Hyperlipidemia No date: Hypogonadism in male No date: Hypothyroidism No date: Neuropathy No date: Peripheral vascular disease, unspecified (HCC) No date: Pre-diabetes No date: Psoriasis No date: Schizophrenia (HCC) No date: Sleep apnea No date: Swelling No date: Thyroid disease  Past Surgical History: 2012: BACK SURGERY No date: COLONOSCOPY 12/19/2018: COLONOSCOPY WITH PROPOFOL ; N/A     Comment:  Procedure: COLONOSCOPY WITH PROPOFOL ;  Surgeon: Toledo,               Alphonsus Jeans, MD;  Location: [facility] ENDOSCOPY;  Service:               Gastroenterology;  Laterality: N/A; No date: KIDNEY SURGERY No date: KNEE ARTHROSCOPY No date: POSTERIOR FUSION LUMBAR SPINE No date: WISDOM TOOTH EXTRACTION      Reproductive/Obstetrics negative OB ROS                             Anesthesia Physical Anesthesia Plan  ASA: 2  Anesthesia Plan: General   Post-op Pain Management: Minimal or no pain anticipated   Induction: Intravenous  PONV Risk Score and Plan: 1 and Propofol  infusion and TIVA  Airway Management Planned: Natural Airway and Nasal Cannula  Additional Equipment:   Intra-op Plan:   Post-operative Plan:   Informed Consent: I have reviewed the patients History and Physical, chart, labs and discussed the procedure including the risks, benefits and alternatives for the proposed anesthesia with the patient or authorized representative who has indicated his/her understanding and acceptance.     Dental Advisory Given  Plan Discussed with: Anesthesiologist, CRNA and Surgeon  Anesthesia Plan Comments: (Patient consented for risks of anesthesia including but not limited to:  - adverse reactions to medications - risk of airway placement if required - damage to eyes, teeth, lips or other oral mucosa - nerve damage due to positioning  - sore throat or hoarseness - Damage to heart, brain, nerves, lungs, other parts of body or loss of life  Patient voiced understanding and assent.)       Anesthesia Quick Evaluation

## 2024-06-04 ENCOUNTER — Encounter: Payer: Self-pay | Admitting: Internal Medicine

## 2024-06-07 ENCOUNTER — Ambulatory Visit: Admitting: Urology

## 2024-06-15 ENCOUNTER — Ambulatory Visit (INDEPENDENT_AMBULATORY_CARE_PROVIDER_SITE_OTHER): Admitting: Podiatry

## 2024-06-15 ENCOUNTER — Encounter: Payer: Self-pay | Admitting: Podiatry

## 2024-06-15 VITALS — Ht 71.0 in | Wt 296.0 lb

## 2024-06-15 DIAGNOSIS — G629 Polyneuropathy, unspecified: Secondary | ICD-10-CM | POA: Diagnosis not present

## 2024-06-15 DIAGNOSIS — B351 Tinea unguium: Secondary | ICD-10-CM | POA: Diagnosis not present

## 2024-06-15 DIAGNOSIS — R234 Changes in skin texture: Secondary | ICD-10-CM | POA: Diagnosis not present

## 2024-06-15 DIAGNOSIS — M79676 Pain in unspecified toe(s): Secondary | ICD-10-CM | POA: Diagnosis not present

## 2024-06-21 ENCOUNTER — Encounter: Payer: Self-pay | Admitting: Podiatry

## 2024-06-21 NOTE — Progress Notes (Signed)
  Subjective:  Patient ID: Geoffrey Cruz, male    DOB: [birth year],  MRN: [id number]  Geoffrey Cruz presents to clinic today for at risk foot care with history of peripheral neuropathy and callus(es) of both feet and painful mycotic toenails that are difficult to trim. Painful toenails interfere with ambulation. Aggravating factors include wearing enclosed shoe gear. Pain is relieved with periodic professional debridement. Painful calluses are aggravated when weightbearing with and without shoegear. Pain is relieved with periodic professional debridement.  Chief Complaint  Patient presents with   Nail Problem    Pt is here for [facility] PCP is Dr Rudolpho sand LOV was in April.   New problem(s): None.   PCP is Rudolpho Norleen BIRCH, MD.  Allergies  Allergen Reactions   Hydrocodone-Acetaminophen Swelling    Swelling in ankles   Propoxyphene Nausea Only   Codeine Other (See Comments) and Anxiety    NERVOUS FEELING NERVOUS FEELING    Review of Systems: Negative except as noted in the HPI.  Objective: No changes noted in today's physical examination. There were no vitals filed for this visit. Geoffrey Cruz is a pleasant 68 y.o. male morbidly obese in NAD. AAO x 3.  Vascular Examination: Vascular status intact b/l with palpable pedal pulses. CFT immediate b/l. No edema. No pain with calf compression b/l. Skin temperature gradient WNL LLE and warm to cool RLE. No cyanosis or clubbing noted b/l LE.  Neurological Examination: Protective sensation diminished with 10g monofilament b/l.  Dermatological Examination: Pedal mildly dry b/l. Toenails 2-5 b/l thick, discolored, elongated with subungual debris and pain on dorsal palpation. Fissured hyperkeratosis distal tip of right great toe.  Musculoskeletal Examination: Muscle strength 5/5 to b/l LE. Hammertoe(s) noted to the left third digit.  Radiographs: None  Assessment/Plan: 1. Pain due to onychomycosis of toenail   2. Fissure in skin of  right foot   3. Peripheral polyneuropathy   Patient was evaluated and treated. All patient's and/or POA's questions/concerns addressed on today's visit. Toenails 2-5 bilaterally debrided in length and girth without incident. Continue soft, supportive shoe gear daily. Report any pedal injuries to medical professional. Call office if there are any questions/concerns. -Patient to apply Aquaphor Lotion to both feet once daily. -Patient to apply Neosporin to right great toe once daily. -Patient/POA to call should there be question/concern in the interim.   No follow-ups on file.  Geoffrey Cruz, DPM      [place] LOCATION: [address], [place] [address]                   Office [phone]   [facility] LOCATION: [address] [place], [place] [address] Office [phone]

## 2024-07-05 ENCOUNTER — Ambulatory Visit: Admitting: Urology

## 2024-07-05 VITALS — BP 149/86 | HR 65 | Ht 71.0 in | Wt 305.0 lb

## 2024-07-05 DIAGNOSIS — Z125 Encounter for screening for malignant neoplasm of prostate: Secondary | ICD-10-CM | POA: Diagnosis not present

## 2024-07-05 DIAGNOSIS — N401 Enlarged prostate with lower urinary tract symptoms: Secondary | ICD-10-CM

## 2024-07-05 DIAGNOSIS — N419 Inflammatory disease of prostate, unspecified: Secondary | ICD-10-CM

## 2024-07-05 DIAGNOSIS — N50811 Right testicular pain: Secondary | ICD-10-CM | POA: Diagnosis not present

## 2024-07-05 LAB — URINALYSIS, COMPLETE
Bilirubin, UA: NEGATIVE
Glucose, UA: NEGATIVE
Ketones, UA: NEGATIVE
Leukocytes,UA: NEGATIVE
Nitrite, UA: NEGATIVE
Protein,UA: NEGATIVE
RBC, UA: NEGATIVE
Specific Gravity, UA: <1.005 — ABNORMAL LOW (ref 1.005–1.030)
Urobilinogen, Ur: 0.2 mg/dL (ref 0.2–1.0)
pH, UA: 6 (ref 5.0–7.5)

## 2024-07-05 LAB — MICROSCOPIC EXAMINATION
Bacteria, UA: NONE SEEN
RBC, Urine: NONE SEEN /HPF (ref 0–2)

## 2024-07-05 NOTE — Progress Notes (Signed)
 I, Maysun LITTIE Griffiths, acting as a scribe for Glendia JAYSON Barba, MD., have documented all relevant documentation on the behalf of Glendia JAYSON Barba, MD, as directed by Glendia JAYSON Barba, MD while in the presence of Glendia JAYSON Barba, MD.  07/05/2024 3:30 PM   Geoffrey Cruz 02/16/1956 [id number]  Referring provider: Rudolpho Norleen BIRCH, MD [address] [facility] [place],  [place] [address]  Chief Complaint  Patient presents with   Prostatitis    HPI: Geoffrey Cruz is a 68 y.o. male presents to re-establish local urologic care.   Previously followed by Dr Kassie for BPH. Status post TUMT in 2018 Last saw Dr Kassie July 2022, and he was doing well.  Since his last urology visit, he does have urinary frequency, which he attributes to drinking a fairly significant amount of water and to Lasix. Voiding symptoms not bothersome.  He has intermittent right hemiscrotal pain, which he describes as a dull ache with radiation in the groin. This has been chronic, although he states he has not had any pain for 2 months. When the pain occurs, he does not take pain medications and states he rides it out No previous history of a vasectomy. Last PSA was performed in 2023 and was 0.80   PMH: Past Medical History:  Diagnosis Date   Anxiety    Bipolar disorder (HCC)    Constipation    Depression    Hernia of abdominal wall    History of prostatitis    Hyperlipidemia    Hypogonadism in male    Hypothyroidism    Neuropathy    Peripheral vascular disease, unspecified (HCC)    Pre-diabetes    Psoriasis    Schizophrenia (HCC)    Sleep apnea    Swelling    Thyroid disease     Surgical History: Past Surgical History:  Procedure Laterality Date   BACK SURGERY  2012   COLONOSCOPY     COLONOSCOPY N/A 05/23/2024   Procedure: COLONOSCOPY;  Surgeon: Toledo, Ladell POUR, MD;  Location: [facility] ENDOSCOPY;  Service: Gastroenterology;  Laterality: N/A;   COLONOSCOPY WITH PROPOFOL  N/A 12/19/2018    Procedure: COLONOSCOPY WITH PROPOFOL ;  Surgeon: Toledo, Ladell POUR, MD;  Location: [facility] ENDOSCOPY;  Service: Gastroenterology;  Laterality: N/A;   KIDNEY SURGERY     KNEE ARTHROSCOPY     POSTERIOR FUSION LUMBAR SPINE     WISDOM TOOTH EXTRACTION      Home Medications:  Allergies as of 07/05/2024       Reactions   Hydrocodone-acetaminophen Swelling   Swelling in ankles   Propoxyphene Nausea Only   Codeine Other (See Comments), Anxiety   NERVOUS FEELING NERVOUS FEELING        Medication List        Accurate as of July 05, 2024  3:30 PM. If you have any questions, ask your nurse or doctor.          ARIPiprazole 20 MG tablet Commonly known as: ABILIFY Take 20 mg by mouth daily.   buPROPion 200 MG 12 hr tablet Commonly known as: WELLBUTRIN SR Take 200 mg by mouth 2 (two) times daily.   clonazePAM 0.5 MG tablet Commonly known as: KLONOPIN Take 0.5 mg by mouth 3 (three) times daily as needed for anxiety.   fluvoxaMINE 100 MG tablet Commonly known as: LUVOX Take 100 mg by mouth 2 (two) times daily.   furosemide 40 MG tablet Commonly known as: LASIX Take 40 mg by mouth daily.  gabapentin  400 MG capsule Commonly known as: NEURONTIN  Take 400 mg by mouth 3 (three) times daily.   levothyroxine 88 MCG tablet Commonly known as: SYNTHROID Take 88 mcg by mouth daily before breakfast.   metFORMIN  500 MG tablet Commonly known as: GLUCOPHAGE  Take 1 tablet (500 mg total) by mouth daily with breakfast.   naproxen 500 MG tablet Commonly known as: NAPROSYN Take 500 mg by mouth 2 (two) times daily with a meal.        Allergies:  Allergies  Allergen Reactions   Hydrocodone-Acetaminophen Swelling    Swelling in ankles   Propoxyphene Nausea Only   Codeine Other (See Comments) and Anxiety    NERVOUS FEELING NERVOUS FEELING    Family History: Family History  Problem Relation Age of Onset   Cancer Mother    Hyperlipidemia Mother    Depression Mother    Heart  failure Father    Hypertension Father    Diabetes Father    Heart disease Father    Heart failure Brother     Social History:  reports that he quit smoking about 35 years ago. His smoking use included cigarettes. He has quit using smokeless tobacco.  His smokeless tobacco use included snuff. He reports that he does not drink alcohol and does not use drugs.   Physical Exam: BP (!) 149/86   Pulse 65   Ht 5' 11 (1.803 m)   Wt (!) 305 lb (138.3 kg)   BMI 42.54 kg/m   Constitutional:  Alert and oriented, No acute distress. HEENT: [place] AT Respiratory: Normal respiratory effort, no increased work of breathing. GU: Phallus without lesion. Left testis descended and the normal right testis is atrophic, non-tender. Psychiatric: Normal mood and affect.  Urinalysis Dipstick/microscopy negative   Assessment & Plan:    1. BPH with LUTS Mild lower urinary tract symptoms which are not bothersome.   2. 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  Patient declined treatment for scrotal pain and will return if pain increases  3 Prostate cancer screening Benign DRE Last PSA 2023 and was drawn today.  He will follow up in 1 year.  I have reviewed the above documentation for accuracy and completeness, and I agree with the above.   Glendia JAYSON Barba, MD  [facility] [address] [place], [place] [address] [phone]

## 2024-07-06 LAB — PSA: Prostate Specific Ag, Serum: 0.9 ng/mL (ref 0.0–4.0)

## 2024-07-08 ENCOUNTER — Ambulatory Visit: Payer: Self-pay | Admitting: Urology

## 2024-07-09 NOTE — Telephone Encounter (Signed)
 Patient aware of psa results.

## 2024-07-10 ENCOUNTER — Encounter: Payer: Self-pay | Admitting: Urology

## 2024-07-30 ENCOUNTER — Other Ambulatory Visit: Payer: Self-pay | Admitting: Sports Medicine

## 2024-07-30 DIAGNOSIS — S4991XA Unspecified injury of right shoulder and upper arm, initial encounter: Secondary | ICD-10-CM

## 2024-07-30 DIAGNOSIS — S46011A Strain of muscle(s) and tendon(s) of the rotator cuff of right shoulder, initial encounter: Secondary | ICD-10-CM

## 2024-08-07 ENCOUNTER — Ambulatory Visit
Admission: RE | Admit: 2024-08-07 | Discharge: 2024-08-07 | Disposition: A | Discharge status: Home / Self Care | Source: Ambulatory Visit | Attending: Sports Medicine | Admitting: Sports Medicine

## 2024-08-07 DIAGNOSIS — S46011A Strain of muscle(s) and tendon(s) of the rotator cuff of right shoulder, initial encounter: Secondary | ICD-10-CM | POA: Insufficient documentation

## 2024-08-07 DIAGNOSIS — S4991XA Unspecified injury of right shoulder and upper arm, initial encounter: Secondary | ICD-10-CM | POA: Diagnosis present

## 2024-09-17 ENCOUNTER — Ambulatory Visit: Admitting: Podiatry

## 2024-09-17 ENCOUNTER — Encounter: Payer: Self-pay | Admitting: Podiatry

## 2024-09-17 DIAGNOSIS — R234 Changes in skin texture: Secondary | ICD-10-CM

## 2024-09-17 DIAGNOSIS — B351 Tinea unguium: Secondary | ICD-10-CM | POA: Diagnosis not present

## 2024-09-17 DIAGNOSIS — L853 Xerosis cutis: Secondary | ICD-10-CM

## 2024-09-17 DIAGNOSIS — M79676 Pain in unspecified toe(s): Secondary | ICD-10-CM

## 2024-09-17 MED ORDER — AMMONIUM LACTATE 12 % EX LOTN: 1.0000 | TOPICAL_LOTION | CUTANEOUS | 5 refills | Status: AC | PRN

## 2024-09-17 NOTE — Progress Notes (Signed)
  Subjective:  Patient ID: Geoffrey Cruz, male    DOB: 1956-08-28,  MRN: [id number]  Geoffrey Cruz presents to clinic today for painful mycotic toenails of both feet that are difficult to trim. Pain interferes with daily activities and wearing enclosed shoe gear comfortably. Patient stats he has been applying Aquaphor Ointment to his feet daily, but heels are still cracked and skin is still dry. Chief Complaint  Patient presents with   Toe Pain    RFC. Denies being diabetic. He sees Dr. Rudolpho at [facility] as his PCP   PCP is Rudolpho Norleen BIRCH, MD.  Allergies  Allergen Reactions   Hydrocodone-Acetaminophen Swelling    Swelling in ankles   Propoxyphene Nausea Only   Codeine Other (See Comments) and Anxiety    NERVOUS FEELING NERVOUS FEELING    Review of Systems: Negative except as noted in the HPI.  Objective: No changes noted in today's physical examination. There were no vitals filed for this visit. Geoffrey Cruz is a pleasant 68 y.o. male morbidly obese in NAD. AAO x 3.  Vascular Examination: Vascular status intact b/l with palpable pedal pulses. CFT immediate b/l. No edema. No pain with calf compression b/l. Skin temperature gradient WNL LLE and warm to cool RLE. No cyanosis or clubbing noted b/l LE.  Neurological Examination: Protective sensation diminished with 10g monofilament b/l.  Dermatological Examination: Pedal mildly dry b/l. Toenails 2-5 b/l thick, discolored, elongated with subungual debris and pain on dorsal palpation. Fissured hyperkeratosis  periphery of both heels and distal tip of right great toe.  Musculoskeletal Examination: Muscle strength 5/5 to b/l LE. Hammertoe(s) noted to the left third digit.  Radiographs: None  Assessment/Plan: 1. Pain due to onychomycosis of toenail   2. Fissures in skin of both feet   3. Xerosis cutis     Meds ordered this encounter  Medications   ammonium lactate (LAC-HYDRIN) 12 % lotion    Sig: Apply 1 Application  topically as needed for dry skin.    Dispense:  400 g    Refill:  5  -Consent given for treatment as described below: -Examined patient. -Patient to continue soft, supportive shoe gear daily. -Mycotic toenails 1-5 bilaterally were debrided in length and girth with sterile nail nippers and dremel without incident. -For xerosis, Rx sent for Ammonium Lactate Lotion 12%. Apply to feet twice daily avoiding application between toes. He is to apply the Aquaphor Ointment after applying lotion. -Patient/POA to call should there be question/concern in the interim.   Return in about 3 months (around 12/18/2024).  Delon LITTIE Merlin, DPM      [place] LOCATION: [address], [place] [address]                   Office [phone]   [facility] LOCATION: [address] [place], [place] [address] Office [phone]

## 2024-11-20 ENCOUNTER — Ambulatory Visit: Admitting: Podiatry

## 2024-11-20 DIAGNOSIS — L989 Disorder of the skin and subcutaneous tissue, unspecified: Secondary | ICD-10-CM | POA: Diagnosis not present

## 2024-11-20 NOTE — Progress Notes (Unsigned)
 Subjective:  Patient ID: Geoffrey Cruz, male    DOB: Mar 19, 1956,  MRN: [id number]  Chief Complaint  Patient presents with   Toe Pain    Pt stated that he has a lot of dry skin and cracked heels that cause him some pain    68 y.o. male presents with the above complaint.  Patient presents with left toe porokeratotic lesion painful to touch is progressive and worsens with ambulation and shoe pressure has not seen MRIs prior to seeing me denies any other acute complaints.  Like to discuss treatment options for it pain scale is 7 out of 10.   Review of Systems: Negative except as noted in the HPI. Denies N/V/F/Ch.  Past Medical History:  Diagnosis Date   Anxiety    Bipolar disorder (HCC)    Constipation    Depression    Hernia of abdominal wall    History of prostatitis    Hyperlipidemia    Hypogonadism in male    Hypothyroidism    Neuropathy    Peripheral vascular disease, unspecified    Pre-diabetes    Psoriasis    Schizophrenia (HCC)    Sleep apnea    Swelling    Thyroid disease    Current Medications[1]  Tobacco Use History[2]  Allergies[3] Objective:  There were no vitals filed for this visit. There is no height or weight on file to calculate BMI. Constitutional Well developed. Well nourished.  Vascular Dorsalis pedis pulses palpable bilaterally. Posterior tibial pulses palpable bilaterally. Capillary refill normal to all digits.  No cyanosis or clubbing noted. Pedal hair growth normal.  Neurologic Normal speech. Oriented to person, place, and time. Epicritic sensation to light touch grossly present bilaterally.  Dermatologic Right heel porokeratotic lesion painful to touch is progressing and worse worse with ambulation or shoe or pressure send or nucleated core noted.  No pinpoint bleeding noted  Orthopedic: Normal joint ROM without pain or crepitus bilaterally. No visible deformities. No bony tenderness.   Radiographs: None Assessment:   1. Benign skin  lesion    Plan:  Patient was evaluated and treated and all questions answered.  Right heel benign skin lesion/porokeratotic lesion - All questions and concerns were discussed with the patient extensive detail given the amount of pain that she is experiencing she would benefit from aggressive debridement of the lesion.  Using chisel blade handle the lesion was debride down to healthy stripe tissue no complication noted no pinpoint bleeding noted.   No follow-ups on file.    [1]  Current Outpatient Medications:    ammonium lactate  (LAC-HYDRIN ) 12 % lotion, Apply 1 Application topically as needed for dry skin., Disp: 400 g, Rfl: 5   ARIPiprazole (ABILIFY) 20 MG tablet, Take 20 mg by mouth daily., Disp: , Rfl:    buPROPion (WELLBUTRIN SR) 200 MG 12 hr tablet, Take 200 mg by mouth 2 (two) times daily., Disp: , Rfl:    clonazePAM (KLONOPIN) 0.5 MG tablet, Take 0.5 mg by mouth 3 (three) times daily as needed for anxiety., Disp: , Rfl:    fluvoxaMINE (LUVOX) 100 MG tablet, Take 100 mg by mouth 2 (two) times daily., Disp: , Rfl:    furosemide (LASIX) 40 MG tablet, Take 40 mg by mouth daily., Disp: , Rfl:    gabapentin  (NEURONTIN ) 400 MG capsule, Take 400 mg by mouth 3 (three) times daily., Disp: , Rfl:    levothyroxine (SYNTHROID, LEVOTHROID) 88 MCG tablet, Take 88 mcg by mouth daily before breakfast., Disp: , Rfl:  metFORMIN  (GLUCOPHAGE ) 500 MG tablet, Take 1 tablet (500 mg total) by mouth daily with breakfast. (Patient not taking: Reported on 07/05/2024), Disp: 30 tablet, Rfl: 0   naproxen (NAPROSYN) 500 MG tablet, Take 500 mg by mouth 2 (two) times daily with a meal. (Patient not taking: Reported on 07/05/2024), Disp: , Rfl:   Current Facility-Administered Medications:    betamethasone  acetate-betamethasone  sodium phosphate (CELESTONE ) injection 3 mg, 3 mg, Intra-articular, Once,  [2]  Social History Tobacco Use  Smoking Status Former   Current packs/day: 0.00   Types: Cigarettes   Quit  date: 10/03/1988   Years since quitting: 36.1  Smokeless Tobacco Former   Types: Snuff  [3]  Allergies Allergen Reactions   Hydrocodone-Acetaminophen Swelling    Swelling in ankles   Propoxyphene Nausea Only   Codeine Other (See Comments) and Anxiety    NERVOUS FEELING NERVOUS FEELING

## 2024-12-27 ENCOUNTER — Ambulatory Visit: Admitting: Podiatry

## 2024-12-28 ENCOUNTER — Other Ambulatory Visit: Payer: Self-pay | Admitting: Family Medicine

## 2024-12-28 ENCOUNTER — Encounter: Payer: Self-pay | Admitting: Family Medicine

## 2024-12-28 DIAGNOSIS — M5416 Radiculopathy, lumbar region: Secondary | ICD-10-CM

## 2024-12-29 ENCOUNTER — Inpatient Hospital Stay
Admission: RE | Admit: 2024-12-29 | Discharge: 2024-12-29 | Disposition: A | Source: Ambulatory Visit | Attending: Family Medicine | Admitting: Family Medicine

## 2024-12-29 DIAGNOSIS — M5416 Radiculopathy, lumbar region: Secondary | ICD-10-CM

## 2025-01-10 ENCOUNTER — Encounter: Payer: Self-pay | Admitting: Podiatry

## 2025-01-10 ENCOUNTER — Ambulatory Visit: Admitting: Podiatry

## 2025-01-24 ENCOUNTER — Ambulatory Visit: Admitting: Orthopedic Surgery

## 2025-03-21 ENCOUNTER — Ambulatory Visit: Admitting: Podiatry

## 2025-07-05 ENCOUNTER — Other Ambulatory Visit

## 2025-07-08 ENCOUNTER — Ambulatory Visit: Admitting: Urology
